# Patient Record
Sex: Male | Born: 1955 | Race: White | Hispanic: No | Marital: Married | Smoking: Never smoker
Health system: Southern US, Community
[De-identification: ages and names within clinical notes are randomized; demographics above are authoritative.]

## PROBLEM LIST (undated history)

## (undated) DIAGNOSIS — I10 Essential (primary) hypertension: Secondary | ICD-10-CM

## (undated) DIAGNOSIS — E785 Hyperlipidemia, unspecified: Secondary | ICD-10-CM

## (undated) DIAGNOSIS — F419 Anxiety disorder, unspecified: Secondary | ICD-10-CM

## (undated) HISTORY — PX: OTHER SURGICAL HISTORY: SHX169

## (undated) HISTORY — DX: Hyperlipidemia, unspecified: E78.5

## (undated) HISTORY — PX: HERNIA REPAIR: SHX51

---

## 2008-08-10 ENCOUNTER — Encounter: Admission: RE | Admit: 2008-08-10 | Discharge: 2008-08-10 | Payer: Self-pay | Admitting: Orthopedic Surgery

## 2008-08-17 ENCOUNTER — Ambulatory Visit (HOSPITAL_COMMUNITY): Admission: RE | Admit: 2008-08-17 | Discharge: 2008-08-17 | Payer: Self-pay | Admitting: Orthopedic Surgery

## 2008-12-01 ENCOUNTER — Encounter: Admission: RE | Admit: 2008-12-01 | Discharge: 2008-12-01 | Payer: Self-pay | Admitting: Orthopedic Surgery

## 2011-02-25 NOTE — Op Note (Signed)
NAME:  Sean Wells, MEDLEN NO.:  [id number]   MEDICAL RECORD NO.:  [id number]          PATIENT TYPE:  AMB   LOCATION:  DAY                          FACILITY:  [facility]   PHYSICIAN:  John L. Rendall, M.D.  DATE OF BIRTH:  [birth year]   DATE OF PROCEDURE:  08/17/2008  DATE OF DISCHARGE:                               OPERATIVE REPORT   PREOPERATIVE DIAGNOSIS:  Partial quadriceps tendon and complete vastus  medialis avulsion right knee.   POSTOPERATIVE DIAGNOSIS:  Partial quadriceps tendon and complete vastus  medialis avulsion right knee.   SURGICAL PROCEDURES:  Repair of quad tendon and vastus medialis avulsion  right knee.   SURGEON:  John L. Rendall, M.D.   ASSISTANT:  Legrand Pitts. Duffy, P.A.   ANESTHESIA:  General.   PATHOLOGY:  The patient has a history of sudden onset of severe weakness  in the right knee, inability to extend, x-ray showing patella baja and  MRI showing medial quadriceps tendon avulsion from the patella and  complete avulsion of the vastus medialis tendon from the patella.  These  findings were verified at surgery.   PROCEDURE IN DETAIL:  Under general anesthesia the right leg was  prepared with DuraPrep.  A 4 inch midline incision is made just slightly  medial over the patella extending from distal quad tendon to inferior  pole of the patella.  Dissection was carried medially around the side of  the patella and a complete avulsion of vastus medialis tendon from the  patella was found.  Dissection was then carried proximally and an area  of thinning of the quad tendon was found suggested medially where the  vastus medialis avulsed medially.  The capsule was entered and the quad  tendon was palpated from the inside.  It was felt that there was about a  sixteenth of an inch of quad tendon attached to the superior medial part  of the patella on its medial one-half whereas it should have been a full  quarter inch.  The superior pole of the patella  was exposed from the  intra-articular site and the normal attachment of the quadriceps was  denuded of fibrous tissue with a small curette.  Two drill holes were  placed and #2 Force Fiber was placed from the anterior lateral surface  of the patella up into the quad tendon in a modified Tajima stitch  pulling it down to the patella giving a quarter inch attachment  literally of the quad tendon to the entire medial half of the patella.  This stitch was not tied down immediately but similar drill holes were  placed medially after dissection out of the vastus medialis tendon.  In  a similar manner two drill holes were placed and a #2 Force Fiber was  used to grasp the vastus medialis tendon in a modified Tajima stitch  also pulling it to the patella in a prepared bone bed prepared by  curette.  These too were then tied to the patella medially and  superiorly.  Excellent repair of anatomic anatomy was felt to be  obtained.  Remaining defects in  the medial capsule and quad tendon were  then closed with #2 Ethibond.  Subcu was closed with zero Vicryl and  subcu further with 2-0 Vicryl.  Tourniquet time was 31 minutes.  Sterile  dressing was applied after skin clips.  The wound was infiltrated with  Marcaine 0.25% with epi, approximately 10 mL.      John L. Rendall, M.D.  Electronically Signed     JLR/MEDQ  D:  08/17/2008  T:  08/17/2008  Job:  [id number]

## 2011-07-15 LAB — DIFFERENTIAL
Basophils Relative: 0
Eosinophils Absolute: 0.1
Lymphs Abs: 1.1
Monocytes Absolute: 0.6
Monocytes Relative: 8
Neutrophils Relative %: 78 — ABNORMAL HIGH

## 2011-07-15 LAB — COMPREHENSIVE METABOLIC PANEL
ALT: 27
AST: 26
Alkaline Phosphatase: 65
CO2: 30
Chloride: 105
Glucose, Bld: 108 — ABNORMAL HIGH
Potassium: 4
Total Bilirubin: 1.2
Total Protein: 6.7

## 2011-07-15 LAB — CBC
MCV: 97.7
RBC: 4.91

## 2011-07-15 LAB — PROTIME-INR: Prothrombin Time: 12.7

## 2016-02-21 DIAGNOSIS — R1030 Lower abdominal pain, unspecified: Secondary | ICD-10-CM | POA: Diagnosis not present

## 2016-03-24 DIAGNOSIS — M79644 Pain in right finger(s): Secondary | ICD-10-CM | POA: Diagnosis not present

## 2016-07-07 DIAGNOSIS — K080 Exfoliation of teeth due to systemic causes: Secondary | ICD-10-CM | POA: Diagnosis not present

## 2016-08-04 DIAGNOSIS — Z6827 Body mass index (BMI) 27.0-27.9, adult: Secondary | ICD-10-CM | POA: Diagnosis not present

## 2016-08-04 DIAGNOSIS — Z0000 Encounter for general adult medical examination without abnormal findings: Secondary | ICD-10-CM | POA: Diagnosis not present

## 2016-08-04 DIAGNOSIS — R030 Elevated blood-pressure reading, without diagnosis of hypertension: Secondary | ICD-10-CM | POA: Diagnosis not present

## 2016-08-04 DIAGNOSIS — Z23 Encounter for immunization: Secondary | ICD-10-CM | POA: Diagnosis not present

## 2016-08-04 DIAGNOSIS — Z113 Encounter for screening for infections with a predominantly sexual mode of transmission: Secondary | ICD-10-CM | POA: Diagnosis not present

## 2016-08-04 DIAGNOSIS — Z6830 Body mass index (BMI) 30.0-30.9, adult: Secondary | ICD-10-CM | POA: Diagnosis not present

## 2016-08-21 DIAGNOSIS — R030 Elevated blood-pressure reading, without diagnosis of hypertension: Secondary | ICD-10-CM | POA: Diagnosis not present

## 2016-08-21 DIAGNOSIS — F5109 Other insomnia not due to a substance or known physiological condition: Secondary | ICD-10-CM | POA: Diagnosis not present

## 2016-08-21 DIAGNOSIS — F5102 Adjustment insomnia: Secondary | ICD-10-CM | POA: Diagnosis not present

## 2016-09-17 DIAGNOSIS — Z1211 Encounter for screening for malignant neoplasm of colon: Secondary | ICD-10-CM | POA: Diagnosis not present

## 2016-09-17 DIAGNOSIS — Z800 Family history of malignant neoplasm of digestive organs: Secondary | ICD-10-CM | POA: Diagnosis not present

## 2016-09-17 DIAGNOSIS — D125 Benign neoplasm of sigmoid colon: Secondary | ICD-10-CM | POA: Diagnosis not present

## 2016-09-17 DIAGNOSIS — K635 Polyp of colon: Secondary | ICD-10-CM | POA: Diagnosis not present

## 2016-09-17 DIAGNOSIS — D123 Benign neoplasm of transverse colon: Secondary | ICD-10-CM | POA: Diagnosis not present

## 2016-11-24 DIAGNOSIS — E782 Mixed hyperlipidemia: Secondary | ICD-10-CM | POA: Diagnosis not present

## 2016-11-24 DIAGNOSIS — I10 Essential (primary) hypertension: Secondary | ICD-10-CM | POA: Diagnosis not present

## 2016-11-25 DIAGNOSIS — E785 Hyperlipidemia, unspecified: Secondary | ICD-10-CM | POA: Diagnosis not present

## 2016-12-22 DIAGNOSIS — I10 Essential (primary) hypertension: Secondary | ICD-10-CM | POA: Diagnosis not present

## 2017-01-05 DIAGNOSIS — I10 Essential (primary) hypertension: Secondary | ICD-10-CM | POA: Diagnosis not present

## 2017-01-05 DIAGNOSIS — F419 Anxiety disorder, unspecified: Secondary | ICD-10-CM | POA: Diagnosis not present

## 2017-02-25 DIAGNOSIS — I10 Essential (primary) hypertension: Secondary | ICD-10-CM | POA: Diagnosis not present

## 2017-02-25 DIAGNOSIS — F419 Anxiety disorder, unspecified: Secondary | ICD-10-CM | POA: Diagnosis not present

## 2017-02-25 DIAGNOSIS — Z6827 Body mass index (BMI) 27.0-27.9, adult: Secondary | ICD-10-CM | POA: Diagnosis not present

## 2017-03-11 DIAGNOSIS — R1031 Right lower quadrant pain: Secondary | ICD-10-CM | POA: Diagnosis not present

## 2017-03-19 DIAGNOSIS — R1031 Right lower quadrant pain: Secondary | ICD-10-CM | POA: Diagnosis not present

## 2017-03-19 DIAGNOSIS — K5730 Diverticulosis of large intestine without perforation or abscess without bleeding: Secondary | ICD-10-CM | POA: Diagnosis not present

## 2017-04-27 DIAGNOSIS — F5221 Male erectile disorder: Secondary | ICD-10-CM | POA: Diagnosis not present

## 2017-04-27 DIAGNOSIS — I10 Essential (primary) hypertension: Secondary | ICD-10-CM | POA: Diagnosis not present

## 2017-04-27 DIAGNOSIS — F419 Anxiety disorder, unspecified: Secondary | ICD-10-CM | POA: Diagnosis not present

## 2017-07-28 ENCOUNTER — Other Ambulatory Visit: Payer: Self-pay

## 2017-07-28 ENCOUNTER — Encounter (HOSPITAL_COMMUNITY): Payer: Self-pay

## 2017-07-28 ENCOUNTER — Emergency Department (HOSPITAL_COMMUNITY): Payer: Federal, State, Local not specified - PPO

## 2017-07-28 ENCOUNTER — Emergency Department (HOSPITAL_COMMUNITY)
Admission: EM | Admit: 2017-07-28 | Discharge: 2017-07-28 | Disposition: A | Discharge status: Home / Self Care | Payer: Federal, State, Local not specified - PPO | Attending: Physician Assistant | Admitting: Physician Assistant

## 2017-07-28 DIAGNOSIS — Z79899 Other long term (current) drug therapy: Secondary | ICD-10-CM | POA: Diagnosis not present

## 2017-07-28 DIAGNOSIS — R071 Chest pain on breathing: Secondary | ICD-10-CM | POA: Insufficient documentation

## 2017-07-28 DIAGNOSIS — R0789 Other chest pain: Secondary | ICD-10-CM | POA: Diagnosis not present

## 2017-07-28 DIAGNOSIS — R0602 Shortness of breath: Secondary | ICD-10-CM | POA: Diagnosis not present

## 2017-07-28 DIAGNOSIS — R079 Chest pain, unspecified: Secondary | ICD-10-CM | POA: Diagnosis not present

## 2017-07-28 DIAGNOSIS — I10 Essential (primary) hypertension: Secondary | ICD-10-CM | POA: Diagnosis not present

## 2017-07-28 HISTORY — DX: Anxiety disorder, unspecified: F41.9

## 2017-07-28 HISTORY — DX: Essential (primary) hypertension: I10

## 2017-07-28 LAB — I-STAT TROPONIN, ED
Troponin i, poc: 0 ng/mL (ref 0.00–0.08)
Troponin i, poc: 0 ng/mL (ref 0.00–0.08)

## 2017-07-28 LAB — BASIC METABOLIC PANEL
ANION GAP: 9 (ref 5–15)
BUN: 14 mg/dL (ref 6–20)
CHLORIDE: 104 mmol/L (ref 101–111)
CO2: 24 mmol/L (ref 22–32)
Calcium: 9.3 mg/dL (ref 8.9–10.3)
Creatinine, Ser: 1.25 mg/dL — ABNORMAL HIGH (ref 0.61–1.24)
GFR calc Af Amer: >60 mL/min (ref >60)
GLUCOSE: 133 mg/dL — AB (ref 65–99)
POTASSIUM: 3.8 mmol/L (ref 3.5–5.1)
Sodium: 137 mmol/L (ref 135–145)

## 2017-07-28 LAB — CBC
HEMATOCRIT: 47.6 % (ref 39.0–52.0)
HEMOGLOBIN: 16.3 g/dL (ref 13.0–17.0)
MCH: 33 pg (ref 26.0–34.0)
MCHC: 34.2 g/dL (ref 30.0–36.0)
MCV: 96.4 fL (ref 78.0–100.0)
Platelets: 270 10*3/uL (ref 150–400)
RBC: 4.94 MIL/uL (ref 4.22–5.81)
RDW: 13 % (ref 11.5–15.5)
WBC: 8 10*3/uL (ref 4.0–10.5)

## 2017-07-28 MED ORDER — CYCLOBENZAPRINE HCL 10 MG PO TABS: 10.0000 mg | ORAL_TABLET | Freq: Two times a day (BID) | ORAL | 0 refills | Status: DC | PRN

## 2017-07-28 MED ORDER — IBUPROFEN 800 MG PO TABS: 800.0000 mg | ORAL_TABLET | Freq: Three times a day (TID) | ORAL | 0 refills | Status: DC

## 2017-07-28 NOTE — ED Provider Notes (Signed)
[facility] EMERGENCY DEPARTMENT Provider Note   CSN: [id number] Arrival date & time: 07/28/17  1139     History   Chief Complaint Chief Complaint  Patient presents with  . Chest Pain  . Shortness of Breath    HPI Demone Lyles is a 61 y.o. male.  HPI   Patient is a 61 year old male with past medical history significant for hypertension. No diabetes no cholesterol. Patient has a brother that had aortic valve disease. He has a mother that had "a large heart" at age 109.  Patient has chest pain intermittently since yesterday. He reports its only present when he takes deep breaths. Otherwise not better. Patient's had no cough no congestion. Patient reports it is also present when moving his arms occasionally. He says it feels like a pulled muscle.  Past Medical History:  Diagnosis Date  . Anxiety   . Hypertension     There are no active problems to display for this patient.   History reviewed. No pertinent surgical history.     Home Medications    Prior to Admission medications   Medication Sig Start Date End Date Taking? Authorizing Provider  amLODipine (NORVASC) 10 MG tablet Take 10 mg by mouth daily. 04/27/17  Yes [provider]  CREATINE PO Take 1 capsule by mouth daily.   Yes [provider]  ibuprofen (ADVIL,MOTRIN) 200 MG tablet Take 200 mg by mouth every 6 (six) hours as needed for moderate pain.   Yes [provider]  Multiple Vitamin (MULTIVITAMIN) tablet Take 1 tablet by mouth daily.   Yes [provider]  PARoxetine (PAXIL) 30 MG tablet Take 30 mg by mouth daily. 04/27/17  Yes [provider]    Family History No family history on file.  Social History Social History  Substance Use Topics  . Smoking status: Never Smoker  . Smokeless tobacco: Never Used  . Alcohol use Not on file     Allergies   Patient has no known allergies.   Review of Systems Review of Systems    Constitutional: Negative for activity change.  Respiratory: Negative for shortness of breath.   Cardiovascular: Positive for chest pain.  Gastrointestinal: Negative for abdominal pain.     Physical Exam Updated Vital Signs BP (!) 147/86   Pulse 65   Temp 98 F (36.7 C) (Oral)   Resp 17   Ht  (1.727 m)   Wt 86.2 kg (190 lb)   SpO2 98%   BMI 28.89 kg/m   Physical Exam  Constitutional: He is oriented to person, place, and time. He appears well-nourished.  HENT:  Head: Normocephalic.  Eyes: Conjunctivae are normal.  Cardiovascular: Normal rate and regular rhythm.   Pulmonary/Chest: Effort normal and breath sounds normal. No respiratory distress.  Tenderness to palpation of left chest wall.  Abdominal: Soft. He exhibits no distension. There is no tenderness.  Neurological: He is oriented to person, place, and time.  Skin: Skin is warm and dry. He is not diaphoretic.  Psychiatric: He has a normal mood and affect. His behavior is normal.     ED Treatments / Results  Labs (all labs ordered are listed, but only abnormal results are displayed) Labs Reviewed  BASIC METABOLIC PANEL - Abnormal; Notable for the following:       Result Value   Glucose, Bld 133 (*)    Creatinine, Ser 1.25 (*)    All other components within normal limits  CBC  I-STAT TROPONIN, ED  I-STAT TROPONIN, ED    EKG  EKG Interpretation  Date/Time:  Tuesday July 28 2017 11:43:23 EDT Ventricular Rate:  85 PR Interval:  158 QRS Duration: 150 QT Interval:  414 QTC Calculation: 492 R Axis:   -67 Text Interpretation:  Normal sinus rhythm Left bundle branch block Confirmed by Corlis Leak, Damaria Vachon ([id number]) on 07/28/2017 2:59:33 PM       Radiology Dg Chest 2 View  Result Date: 07/28/2017 CLINICAL DATA:  Chest pain, dizziness, some shortness of breath EXAM: CHEST  2 VIEW COMPARISON:  None. FINDINGS: No active infiltrate or effusion is seen. Mediastinal and hilar contours are unremarkable. Mild  cardiomegaly is present. No bony abnormality is seen. IMPRESSION: No active lung disease.  Mild cardiomegaly. Electronically Signed   By: Dwyane Dee M.D.   On: 07/28/2017 12:15    Procedures Procedures (including critical care time)  Medications Ordered in ED Medications - No data to display   Initial Impression / Assessment and Plan / ED Course  I have reviewed the triage vital signs and the nursing notes.  Pertinent labs & imaging results that were available during my care of the patient were reviewed by me and considered in my medical decision making (see chart for details).    Patient is a 61 year old male with past medical history significant for hypertension. No diabetes no cholesterol. Patient has a brother that had aortic valve disease. He has a mother that had "a large heart" at age 83.  Patient has chest pain intermittently since yesterday. He reports its only present when he takes deep breaths.nonexertional. Does not go to neck or down arms.  Patient's had no cough no congestion. Patient reports it is also present when moving his arms occasionally. He says it feels like a pulled muscle.   2:57 PM Heart score is 3.Delta troponin. Otherwise will diagnose with muscle sprain. Given patient's past medical history (mild cardiomegaly on cxr) we'll encourage him to follow up with cardiologist.patient had a lot of increased stressors at home.   Final Clinical Impressions(s) / ED Diagnoses   Final diagnoses:  None    New Prescriptions New Prescriptions   No medications on file     Abelino Derrick, MD 07/28/17 1502

## 2017-07-28 NOTE — Discharge Instructions (Signed)
We think this is likely a pulled muscle given that it is worse with breaths and moving. However you do  have history of high blood pressure which makes a risk for cardiac disease. Please follow-up with cardiologist and return if your chest pain worsens, shortness of breath, exertional chest pain or other concerns.

## 2017-08-03 DIAGNOSIS — Z23 Encounter for immunization: Secondary | ICD-10-CM | POA: Diagnosis not present

## 2017-08-03 DIAGNOSIS — I517 Cardiomegaly: Secondary | ICD-10-CM | POA: Diagnosis not present

## 2017-08-03 DIAGNOSIS — I10 Essential (primary) hypertension: Secondary | ICD-10-CM | POA: Diagnosis not present

## 2017-08-05 DIAGNOSIS — I340 Nonrheumatic mitral (valve) insufficiency: Secondary | ICD-10-CM | POA: Diagnosis not present

## 2017-08-05 DIAGNOSIS — I371 Nonrheumatic pulmonary valve insufficiency: Secondary | ICD-10-CM | POA: Diagnosis not present

## 2017-08-05 DIAGNOSIS — I517 Cardiomegaly: Secondary | ICD-10-CM | POA: Diagnosis not present

## 2017-08-12 ENCOUNTER — Ambulatory Visit (INDEPENDENT_AMBULATORY_CARE_PROVIDER_SITE_OTHER): Payer: Federal, State, Local not specified - PPO | Admitting: Cardiology

## 2017-08-12 VITALS — BP 110/70 | HR 64 | Resp 12 | Ht 68.25 in | Wt 189.0 lb

## 2017-08-12 DIAGNOSIS — I517 Cardiomegaly: Secondary | ICD-10-CM

## 2017-08-12 DIAGNOSIS — R0789 Other chest pain: Secondary | ICD-10-CM

## 2017-08-12 DIAGNOSIS — I10 Essential (primary) hypertension: Secondary | ICD-10-CM

## 2017-08-12 DIAGNOSIS — I429 Cardiomyopathy, unspecified: Secondary | ICD-10-CM

## 2017-08-12 DIAGNOSIS — I447 Left bundle-branch block, unspecified: Secondary | ICD-10-CM

## 2017-08-12 DIAGNOSIS — I420 Dilated cardiomyopathy: Secondary | ICD-10-CM | POA: Diagnosis not present

## 2017-08-12 HISTORY — DX: Left bundle-branch block, unspecified: I44.7

## 2017-08-12 HISTORY — DX: Other chest pain: R07.89

## 2017-08-12 HISTORY — DX: Cardiomyopathy, unspecified: I42.9

## 2017-08-12 HISTORY — DX: Cardiomegaly: I51.7

## 2017-08-12 MED ORDER — LISINOPRIL 2.5 MG PO TABS: 2.5000 mg | ORAL_TABLET | Freq: Every day | ORAL | 6 refills | Status: DC

## 2017-08-12 NOTE — Patient Instructions (Addendum)
Medication Instructions:  Your physician has recommended you make the following change in your medication:  1.) START Lisinopril 2.5 mg daily.  2.) DECREASE norvasc to 5 mg daily.   1. Avoid all over-the-counter antihistamines except Claritin/Loratadine and Zyrtec/Cetrizine. 2. Avoid all combination including cold sinus allergies flu decongestant and sleep medications 3. You can use Robitussin DM Mucinex and Mucinex DM for cough. 4. can use Tylenol aspirin ibuprofen and naproxen but no combinations such as sleep or sinus.  Labwork: Your physician recommends that you return for lab work in: 1 week  Testing/Procedures: EKG today in office.   Follow-Up: Your physician recommends that you schedule a follow-up appointment in: 2 weeks    Any Other Special Instructions Will Be Listed Below (If Applicable).  Please note that any paperwork needing to be filled out by the provider will need to be addressed at the front desk prior to seeing the provider. Please note that any paperwork FMLA, Disability or other documents regarding health condition is subject to a $25.00 charge that must be received prior to completion of paperwork in the form of a money order or check.     If you need a refill on your cardiac medications before your next appointment, please call your pharmacy.

## 2017-08-12 NOTE — Progress Notes (Signed)
Cardiology Consultation:    Date:  08/12/2017   ID:  Sean Wells, DOB [birth year], MRN [id number]  PCP:  [name], MD  Cardiologist:  [name], MD   Referring MD: [name], MD   Chief Complaint  Patient presents with  . Abnormal Echo  I have weak heart  History of Present Illness:    Sean Wells is a 61 y.o. male who is being seen today for the evaluation of cardiomyopathy at the request of [name], MD.  About 2 weeks ago he started experiencing some atypical chest pain located in the left side of his chest worse with taking deep breath worse with coughing he ended up getting to the emergency room troponin I was normal he was discharged home and he was told that he got musculoskeletal pain.  However, chest x-ray done at that time showed cardiomegaly.  He eventually was sent to [facility] for echocardiogram which showed ejection fraction of 35%.  He comes here to talk about that.  Doing well clinically he denies having any more chest pain tightness squeezing pressure burning chest.  He exercised on a regular basis however stopped after visiting the emergency because of chest pain.  He started back and have no difficulty doing it.  No swelling of lower extremities no tightness pressure burning in the chest.  He used to drink alcohol still drinks some wine and beer but not on a daily basis.  Family history of multiple cardiac issues  Past Medical History:  Diagnosis Date  . Anxiety   . Hyperlipidemia   . Hypertension     Past Surgical History:  Procedure Laterality Date  . HERNIA REPAIR    . Torn tendon and patella      Current Medications: Current Meds  Medication Sig  . amLODipine (NORVASC) 10 MG tablet Take 10 mg by mouth daily.  . Multiple Vitamin (MULTIVITAMIN) tablet Take 1 tablet by mouth daily.  . nebivolol (BYSTOLIC) 5 MG tablet Take 5 mg by mouth daily.  Marland Kitchen PARoxetine (PAXIL) 30 MG tablet Take 30 mg by mouth daily.  . Red Yeast  Rice Extract (RED YEAST RICE PO) Take 1 tablet by mouth daily.     Allergies:   Patient has no known allergies.   Social History   Social History  . Marital status: Divorced    Spouse name: N/A  . Number of children: N/A  . Years of education: N/A   Social History Main Topics  . Smoking status: Never Smoker  . Smokeless tobacco: Current User  . Alcohol use Yes  . Drug use: No  . Sexual activity: Not on file   Other Topics Concern  . Not on file   Social History Narrative  . No narrative on file     Family History: The patient's family history includes Cancer in his father; Heart failure in his mother. ROS:   Please see the history of present illness.    All 14 point review of systems negative except as described per history of present illness.  EKGs/Labs/Other Studies Reviewed:    The following studies were reviewed today: Normal sinus rhythm, left bundle branch block  EKG:  EKG is  ordered today.  The ekg ordered today demonstrates   Recent Labs: 07/28/2017: BUN 14; Creatinine, Ser 1.25; Hemoglobin 16.3; Platelets 270; Potassium 3.8; Sodium 137  Recent Lipid Panel No results found for: CHOL, TRIG, HDL, CHOLHDL, VLDL, LDLCALC, LDLDIRECT  Physical Exam:    VS:  BP 110/70  Pulse 64   Resp 12   Ht 5' 8.25" (1.734 m)   Wt 189 lb (85.7 kg)   BMI 28.53 kg/m     Wt Readings from Last 3 Encounters:  08/12/17 189 lb (85.7 kg)  07/28/17 190 lb (86.2 kg)     GEN:  Well nourished, well developed in no acute distress HEENT: Normal NECK: No JVD; No carotid bruits LYMPHATICS: No lymphadenopathy CARDIAC: RRR, no murmurs, no rubs, no gallops RESPIRATORY:  Clear to auscultation without rales, wheezing or rhonchi  ABDOMEN: Soft, non-tender, non-distended MUSCULOSKELETAL:  No edema; No deformity  SKIN: Warm and dry NEUROLOGIC:  Alert and oriented x 3 PSYCHIATRIC:  Normal affect   ASSESSMENT:    1. Essential hypertension   2. Dilated cardiomyopathy (HCC)   3.  Left bundle branch block (LBBB)   4. Atypical chest pain    PLAN:    In order of problems listed above:  1. Cardiomyopathy: The origin and etiology of this phenomenon is unclear at the moment.  He is already on beta-blocker which I will continue.  I will put him on small dose of ACE inhibitor only 2.5 g daily because of some baseline kidney dysfunction.  We will check his creatinine within the next week or so.  The goal will be to put him on the right medication see if there is any improvement.  In terms of evaluating him for etiology of his cardiomyopathy he may require cardiac catheterization.  Likely hemodynamically he seems to be compensated overall seems to be doing well. 2. Left bundle branch block.  I will repeat EEG. 3. Atypical chest pain: He may require cardiac catheterization or at least stress study for evaluation  Overall gentleman with a cardiomyopathy and left bundle branch block.  Kind of surprising discovery had some atypical chest pain we will try to manage him medically for now but he may require more aggressive management which can include cardiac catheterization   Medication Adjustments/Labs and Tests Ordered: Current medicines are reviewed at length with the patient today.  Concerns regarding medicines are outlined above.  No orders of the defined types were placed in this encounter.  No orders of the defined types were placed in this encounter.   Signed, Georgeanna Leaobert J. Krasowski, MD, [facility]. 08/12/2017 3:50 PM    [facility]

## 2017-08-26 DIAGNOSIS — R0789 Other chest pain: Secondary | ICD-10-CM | POA: Diagnosis not present

## 2017-08-26 DIAGNOSIS — I420 Dilated cardiomyopathy: Secondary | ICD-10-CM | POA: Diagnosis not present

## 2017-08-26 DIAGNOSIS — I10 Essential (primary) hypertension: Secondary | ICD-10-CM | POA: Diagnosis not present

## 2017-08-27 ENCOUNTER — Other Ambulatory Visit: Payer: Self-pay

## 2017-08-27 ENCOUNTER — Telehealth: Payer: Self-pay

## 2017-08-27 DIAGNOSIS — I10 Essential (primary) hypertension: Secondary | ICD-10-CM

## 2017-08-27 DIAGNOSIS — R0789 Other chest pain: Secondary | ICD-10-CM

## 2017-08-27 DIAGNOSIS — I420 Dilated cardiomyopathy: Secondary | ICD-10-CM

## 2017-08-27 LAB — BASIC METABOLIC PANEL
BUN/Creatinine Ratio: 15 (ref 10–24)
BUN: 16 mg/dL (ref 8–27)
CALCIUM: 9.3 mg/dL (ref 8.6–10.2)
CHLORIDE: 100 mmol/L (ref 96–106)
CO2: 27 mmol/L (ref 20–29)
Creatinine, Ser: 1.09 mg/dL (ref 0.76–1.27)
GFR calc Af Amer: 85 mL/min/{1.73_m2} (ref >59)
GFR, EST NON AFRICAN AMERICAN: 73 mL/min/{1.73_m2} (ref >59)
GLUCOSE: 90 mg/dL (ref 65–99)
POTASSIUM: 4.3 mmol/L (ref 3.5–5.2)
Sodium: 140 mmol/L (ref 134–144)

## 2017-08-27 MED ORDER — LISINOPRIL 5 MG PO TABS: 5.0000 mg | ORAL_TABLET | Freq: Every day | ORAL | 11 refills | Status: DC

## 2017-08-27 NOTE — Telephone Encounter (Signed)
-----   Message from Georgeanna Leaobert J Krasowski, MD sent at 08/27/2017  9:14 AM EST ----- chem7 looks good, double lisinopril to 5 mg po qd  chem7 in 1 week

## 2017-09-09 DIAGNOSIS — F4323 Adjustment disorder with mixed anxiety and depressed mood: Secondary | ICD-10-CM | POA: Diagnosis not present

## 2017-09-11 ENCOUNTER — Ambulatory Visit: Payer: Federal, State, Local not specified - PPO | Admitting: Cardiology

## 2017-09-11 ENCOUNTER — Encounter: Payer: Self-pay | Admitting: Cardiology

## 2017-09-11 VITALS — BP 104/74 | HR 63 | Ht 68.25 in | Wt 190.0 lb

## 2017-09-11 DIAGNOSIS — I447 Left bundle-branch block, unspecified: Secondary | ICD-10-CM

## 2017-09-11 DIAGNOSIS — I517 Cardiomegaly: Secondary | ICD-10-CM

## 2017-09-11 DIAGNOSIS — R0789 Other chest pain: Secondary | ICD-10-CM

## 2017-09-11 DIAGNOSIS — I10 Essential (primary) hypertension: Secondary | ICD-10-CM | POA: Diagnosis not present

## 2017-09-11 DIAGNOSIS — I420 Dilated cardiomyopathy: Secondary | ICD-10-CM | POA: Diagnosis not present

## 2017-09-11 MED ORDER — LISINOPRIL 10 MG PO TABS: 10.0000 mg | ORAL_TABLET | Freq: Every day | ORAL | 1 refills | Status: DC

## 2017-09-11 NOTE — Addendum Note (Signed)
Addended by: Craige CottaANDERSON, Harshita Bernales S on: 09/11/2017 11:58 AM   Modules accepted: Orders

## 2017-09-11 NOTE — Progress Notes (Signed)
Cardiology Office Note:    Date:  09/11/2017   ID:  Sean Wells, DOB August 19, 1956, MRN [id number]  PCP:  Alinda DeemPenner, Pamela, MD  Cardiologist:  Gypsy Balsamobert Krasowski, MD    Referring MD: Alinda DeemPenner, Pamela, MD   No chief complaint on file. Doing well  History of Present Illness:    Sean Wells is a 61 y.o. male with cardiomyopathy which was incidental discovery.  Gradually trying to put him on appropriate medications.  Today he is doing well his blood pressures in the lower side therefore I will discontinue his Norvasc I will increase dose of lisinopril from 5-10 we will check his Chem-7 today we will check his Chem-7 in about 3 days.  Talk to me about by systolic which appears to be price he got 2368-month supply and after that he wants to change to a different one I think carvedilol will be a good choice for him.  We talked again about potentially having cardiac catheterization he said he feels good he denies have any chest pain goes to gym on the regular basis and we reach at the conclusion that we will recheck his heart in about 2-3 months if there is no improvement then we will proceed with cardiac catheterization.  Past Medical History:  Diagnosis Date  . Anxiety   . Hyperlipidemia   . Hypertension     Past Surgical History:  Procedure Laterality Date  . HERNIA REPAIR    . Torn tendon and patella      Current Medications: Current Meds  Medication Sig  . amLODipine (NORVASC) 10 MG tablet Take 5 mg by mouth daily.  Marland Kitchen. lisinopril (PRINIVIL,ZESTRIL) 5 MG tablet Take 1 tablet (5 mg total) daily by mouth.  . Multiple Vitamin (MULTIVITAMIN) tablet Take 1 tablet by mouth daily.  . nebivolol (BYSTOLIC) 5 MG tablet Take 5 mg by mouth daily.  Marland Kitchen. PARoxetine (PAXIL) 30 MG tablet Take 30 mg by mouth daily.  . Red Yeast Rice Extract (RED YEAST RICE PO) Take 1 tablet by mouth daily.     Allergies:   Patient has no known allergies.   Social History   Socioeconomic History  . Marital status:  Divorced    Spouse name: None  . Number of children: None  . Years of education: None  . Highest education level: None  Social Needs  . Financial resource strain: None  . Food insecurity - worry: None  . Food insecurity - inability: None  . Transportation needs - medical: None  . Transportation needs - non-medical: None  Occupational History  . None  Tobacco Use  . Smoking status: Never Smoker  . Smokeless tobacco: Current User  Substance and Sexual Activity  . Alcohol use: Yes  . Drug use: No  . Sexual activity: None  Other Topics Concern  . None  Social History Narrative  . None     Family History: The patient's family history includes Cancer in his father; Heart failure in his mother. ROS:   Please see the history of present illness.    All 14 point review of systems negative except as described per history of present illness  EKGs/Labs/Other Studies Reviewed:      Recent Labs: 07/28/2017: Hemoglobin 16.3; Platelets 270 08/26/2017: BUN 16; Creatinine, Ser 1.09; Potassium 4.3; Sodium 140  Recent Lipid Panel No results found for: CHOL, TRIG, HDL, CHOLHDL, VLDL, LDLCALC, LDLDIRECT  Physical Exam:    VS:  BP 104/74 (BP Location: Right Arm, Patient Position: Sitting, Cuff Size: Normal)  Pulse 63   Ht 5' 8.25" (1.734 m)   Wt 190 lb (86.2 kg)   SpO2 97%   BMI 28.68 kg/m     Wt Readings from Last 3 Encounters:  09/11/17 190 lb (86.2 kg)  08/12/17 189 lb (85.7 kg)  07/28/17 190 lb (86.2 kg)     GEN:  Well nourished, well developed in no acute distress HEENT: Normal NECK: No JVD; No carotid bruits LYMPHATICS: No lymphadenopathy CARDIAC: RRR, no murmurs, no rubs, no gallops RESPIRATORY:  Clear to auscultation without rales, wheezing or rhonchi  ABDOMEN: Soft, non-tender, non-distended MUSCULOSKELETAL:  No edema; No deformity  SKIN: Warm and dry LOWER EXTREMITIES: no swelling NEUROLOGIC:  Alert and oriented x 3 PSYCHIATRIC:  Normal affect   ASSESSMENT:      1. Cardiomegaly   2. Dilated cardiomyopathy (HCC)   3. Essential hypertension   4. Left bundle branch block (LBBB)   5. Atypical chest pain    PLAN:    In order of problems listed above:  1. Cardiomegaly with ejection fraction being diminished: Plan as outlined above we will increase the dose of ACE inhibitor will follow up his kidney function.  We will discontinue his Norvasc. 2. Essential hypertension: We are dealing with the problem of low blood pressure now.  Plan as outlined above 3. Atypical chest pain all the time lasting for days straight of the left side not related to exertion on the same time he can go and exercise in the gym with no difficulties.  Again issue of cardiac catheterization as discussed above.   Medication Adjustments/Labs and Tests Ordered: Current medicines are reviewed at length with the patient today.  Concerns regarding medicines are outlined above.  No orders of the defined types were placed in this encounter.  Medication changes: No orders of the defined types were placed in this encounter.   Signed, Georgeanna Leaobert J. Krasowski, MD, [facility] 09/11/2017 11:46 AM    [facility]

## 2017-09-11 NOTE — Patient Instructions (Signed)
Medication Instructions:  Your physician has recommended you make the following change in your medication:  CHANGE Lisinopril to 10 mg daily STOP Amlodipine  Labwork: Your physician recommends that you have the following labs drawn: BMP today and another BMP in 1 week  Testing/Procedures: None  Follow-Up: Your physician recommends that you schedule a follow-up appointment in: 3 weeks   Any Other Special Instructions Will Be Listed Below (If Applicable).     If you need a refill on your cardiac medications before your next appointment, please call your pharmacy.   [facility]  Garey HamAshley A, RN, BSN

## 2017-09-12 LAB — BASIC METABOLIC PANEL
BUN / CREAT RATIO: 13 (ref 10–24)
BUN: 17 mg/dL (ref 8–27)
CO2: 25 mmol/L (ref 20–29)
CREATININE: 1.32 mg/dL — AB (ref 0.76–1.27)
Calcium: 9.6 mg/dL (ref 8.6–10.2)
Chloride: 101 mmol/L (ref 96–106)
GFR calc Af Amer: 67 mL/min/{1.73_m2} (ref >59)
GFR calc non Af Amer: 58 mL/min/{1.73_m2} — ABNORMAL LOW (ref >59)
GLUCOSE: 95 mg/dL (ref 65–99)
POTASSIUM: 4.4 mmol/L (ref 3.5–5.2)
SODIUM: 140 mmol/L (ref 134–144)

## 2017-09-14 ENCOUNTER — Telehealth: Payer: Self-pay

## 2017-09-14 DIAGNOSIS — I420 Dilated cardiomyopathy: Secondary | ICD-10-CM

## 2017-09-14 NOTE — Telephone Encounter (Signed)
Patient informed of results. [place] [facility] [place].

## 2017-09-14 NOTE — Telephone Encounter (Signed)
-----   Message from Georgeanna Leaobert J Krasowski, MD sent at 09/14/2017  8:52 AM EST ----- Please recheck chem 7 on wednesday

## 2017-09-16 DIAGNOSIS — I10 Essential (primary) hypertension: Secondary | ICD-10-CM | POA: Diagnosis not present

## 2017-09-16 DIAGNOSIS — F4323 Adjustment disorder with mixed anxiety and depressed mood: Secondary | ICD-10-CM | POA: Diagnosis not present

## 2017-09-16 DIAGNOSIS — I420 Dilated cardiomyopathy: Secondary | ICD-10-CM | POA: Diagnosis not present

## 2017-09-16 LAB — BASIC METABOLIC PANEL
BUN/Creatinine Ratio: 17 (ref 10–24)
BUN: 19 mg/dL (ref 8–27)
CHLORIDE: 103 mmol/L (ref 96–106)
CO2: 26 mmol/L (ref 20–29)
Calcium: 10 mg/dL (ref 8.6–10.2)
Creatinine, Ser: 1.13 mg/dL (ref 0.76–1.27)
GFR calc Af Amer: 81 mL/min/{1.73_m2} (ref >59)
GFR, EST NON AFRICAN AMERICAN: 70 mL/min/{1.73_m2} (ref >59)
GLUCOSE: 99 mg/dL (ref 65–99)
POTASSIUM: 4.5 mmol/L (ref 3.5–5.2)
SODIUM: 137 mmol/L (ref 134–144)

## 2017-09-24 DIAGNOSIS — F4323 Adjustment disorder with mixed anxiety and depressed mood: Secondary | ICD-10-CM | POA: Diagnosis not present

## 2017-10-02 ENCOUNTER — Ambulatory Visit: Payer: Federal, State, Local not specified - PPO | Admitting: Cardiology

## 2017-10-02 ENCOUNTER — Encounter: Payer: Self-pay | Admitting: Cardiology

## 2017-10-02 VITALS — BP 134/66 | HR 113 | Ht 68.25 in | Wt 191.8 lb

## 2017-10-02 DIAGNOSIS — I10 Essential (primary) hypertension: Secondary | ICD-10-CM

## 2017-10-02 DIAGNOSIS — I420 Dilated cardiomyopathy: Secondary | ICD-10-CM | POA: Diagnosis not present

## 2017-10-02 DIAGNOSIS — I447 Left bundle-branch block, unspecified: Secondary | ICD-10-CM | POA: Diagnosis not present

## 2017-10-02 DIAGNOSIS — R0789 Other chest pain: Secondary | ICD-10-CM | POA: Diagnosis not present

## 2017-10-02 MED ORDER — SACUBITRIL-VALSARTAN 24-26 MG PO TABS: 1.0000 | ORAL_TABLET | Freq: Two times a day (BID) | ORAL | Status: DC

## 2017-10-02 NOTE — Progress Notes (Signed)
Cardiology Office Note:    Date:  10/02/2017   ID:  Sean Wells, DOB 1956-02-02, MRN [id number]  PCP:  Alinda DeemPenner, Pamela, MD  Cardiologist:  Gypsy Balsamobert Elianis Fischbach, MD    Referring MD: Alinda DeemPenner, Pamela, MD   Chief Complaint  Patient presents with  . 3 week follow up  Doing well denies have any shortness of breath chest pain palpitations no dizziness passing out  History of Present Illness:    Sean Wells is a 61 y.o. male with cardiomyopathy which is unclear origin.  Denies having any symptoms except some shortness of breath with exertion.  He goes to gym on the regular basis typically lifts weight and have no difficulty doing it.  To be tolerating medications well however I think he can benefit from [place].  I will give him samples of Entresto today ask him to stop lisinopril for 3 days before initiating therapy with Entresto.  I asked him to let me know if anything unusual happen with new medication.  Past Medical History:  Diagnosis Date  . Anxiety   . Hyperlipidemia   . Hypertension     Past Surgical History:  Procedure Laterality Date  . HERNIA REPAIR    . Torn tendon and patella      Current Medications: Current Meds  Medication Sig  . lisinopril (PRINIVIL,ZESTRIL) 10 MG tablet Take 1 tablet (10 mg total) by mouth daily.  . Multiple Vitamin (MULTIVITAMIN) tablet Take 1 tablet by mouth daily.  . nebivolol (BYSTOLIC) 5 MG tablet Take 5 mg by mouth daily.  Marland Kitchen. PARoxetine (PAXIL) 30 MG tablet Take 30 mg by mouth daily.  . Red Yeast Rice Extract (RED YEAST RICE PO) Take 1 tablet by mouth daily.     Allergies:   Patient has no known allergies.   Social History   Socioeconomic History  . Marital status: Divorced    Spouse name: None  . Number of children: None  . Years of education: None  . Highest education level: None  Social Needs  . Financial resource strain: None  . Food insecurity - worry: None  . Food insecurity - inability: None  . Transportation needs -  medical: None  . Transportation needs - non-medical: None  Occupational History  . None  Tobacco Use  . Smoking status: Never Smoker  . Smokeless tobacco: Current User  Substance and Sexual Activity  . Alcohol use: Yes  . Drug use: No  . Sexual activity: None  Other Topics Concern  . None  Social History Narrative  . None     Family History: The patient's family history includes Cancer in his father; Heart failure in his mother. ROS:   Please see the history of present illness.    All 14 point review of systems negative except as described per history of present illness  EKGs/Labs/Other Studies Reviewed:      Recent Labs: 07/28/2017: Hemoglobin 16.3; Platelets 270 09/16/2017: BUN 19; Creatinine, Ser 1.13; Potassium 4.5; Sodium 137  Recent Lipid Panel No results found for: CHOL, TRIG, HDL, CHOLHDL, VLDL, LDLCALC, LDLDIRECT  Physical Exam:    VS:  BP 134/66   Pulse (!) 113   Ht 5' 8.25" (1.734 m)   Wt 191 lb 12.8 oz (87 kg)   SpO2 98%   BMI 28.95 kg/m     Wt Readings from Last 3 Encounters:  10/02/17 191 lb 12.8 oz (87 kg)  09/11/17 190 lb (86.2 kg)  08/12/17 189 lb (85.7 kg)     GEN:  Well nourished, well developed in no acute distress HEENT: Normal NECK: No JVD; No carotid bruits LYMPHATICS: No lymphadenopathy CARDIAC: RRR, no murmurs, no rubs, no gallops RESPIRATORY:  Clear to auscultation without rales, wheezing or rhonchi  ABDOMEN: Soft, non-tender, non-distended MUSCULOSKELETAL:  No edema; No deformity  SKIN: Warm and dry LOWER EXTREMITIES: no swelling NEUROLOGIC:  Alert and oriented x 3 PSYCHIATRIC:  Normal affect   ASSESSMENT:    1. Dilated cardiomyopathy (HCC)   2. Essential hypertension   3. Left bundle branch block (LBBB)   4. Atypical chest pain    PLAN:    In order of problems listed above:  1. Dilated cardiomyopathy: On a beta-blocker which I will continue, we will switch him from lisinopril to [facility]. 2. Essential hypertension  blood pressure seems to be well controlled we will continue present management 3. Left bundle branch block, chronic continue present management 4. Atypical chest pain: Denies having any   Medication Adjustments/Labs and Tests Ordered: Current medicines are reviewed at length with the patient today.  Concerns regarding medicines are outlined above.  No orders of the defined types were placed in this encounter.  Medication changes: No orders of the defined types were placed in this encounter.   Signed, Georgeanna Leaobert J. Irva Loser, MD, [facility] 10/02/2017 11:13 AM    [facility]

## 2017-10-02 NOTE — Patient Instructions (Signed)
Medication Instructions:  Your physician has recommended you make the following change in your medication:  1) STOP Lisinopril 2) Start Entresto 24/24 mg 1 tablet twice daily  Labwork: None Ordered  Testing/Procedures: None ordered  Follow-Up: Your physician recommends that you schedule a follow-up appointment in: 1 month Dr. Bing MatterKrasowski  Any Other Special Instructions Will Be Listed Below (If Applicable).     If you need a refill on your cardiac medications before your next appointment, please call your pharmacy.

## 2017-10-14 ENCOUNTER — Telehealth: Payer: Self-pay | Admitting: Cardiology

## 2017-10-14 DIAGNOSIS — F4323 Adjustment disorder with mixed anxiety and depressed mood: Secondary | ICD-10-CM | POA: Diagnosis not present

## 2017-10-14 NOTE — Telephone Encounter (Signed)
Patient states that he was dizzy yesterday, not today. Chest tightness comes and goes. Patient does not want to go to emergency department. Asked patient if he would like to reschedule for sooner appointment. Patient states he will wait and see if he feels better. Advised patient again to go to emergency room if symptoms worsen. Patient verbalized understanding. No further questions.

## 2017-10-14 NOTE — Telephone Encounter (Signed)
Patient called and states he has chest tightness, is tires and dizzy. He does not want to go to ER. Patient [facility] and wonders if this is the cause. Patient told to go to ER if symptoms get worse before call back.

## 2017-10-22 DIAGNOSIS — F4323 Adjustment disorder with mixed anxiety and depressed mood: Secondary | ICD-10-CM | POA: Diagnosis not present

## 2017-10-28 DIAGNOSIS — F4323 Adjustment disorder with mixed anxiety and depressed mood: Secondary | ICD-10-CM | POA: Diagnosis not present

## 2017-11-02 ENCOUNTER — Encounter: Payer: Self-pay | Admitting: Cardiology

## 2017-11-02 ENCOUNTER — Ambulatory Visit: Payer: Federal, State, Local not specified - PPO | Admitting: Cardiology

## 2017-11-02 VITALS — BP 120/78 | HR 59 | Ht 68.25 in | Wt 192.0 lb

## 2017-11-02 DIAGNOSIS — I420 Dilated cardiomyopathy: Secondary | ICD-10-CM

## 2017-11-02 DIAGNOSIS — I447 Left bundle-branch block, unspecified: Secondary | ICD-10-CM | POA: Diagnosis not present

## 2017-11-02 DIAGNOSIS — I10 Essential (primary) hypertension: Secondary | ICD-10-CM

## 2017-11-02 NOTE — Progress Notes (Signed)
Cardiology Office Note:    Date:  11/02/2017   ID:  Sean Wells, DOB 04-Dec-1955, MRN [id number]  PCP:  [name], MD  Cardiologist:  [name], MD    Referring MD: [name], MD   Chief Complaint  Patient presents with  . Follow-up  Doing well  History of Present Illness:    Sean Wells is a 62 y.o. male with cardiomyopathy so far unclear origin.  He does have also a left bundle branch block.  Seems to be tolerating medications quite well.  Last time I put him on Entresto.  Now he complains of by systolic being very expensive and the goal is to finish his by systolic and then switch him to carvedilol.  He comes today with his wife and she complains that every single night he sweats a lot and he can be completely wet that required changing his clothes during the night.  Denies having any nightmares but of course concerns about potentially having bradycardia.  He does snore a lot and does some apneic episodes but those are rather rare.  Talking about potentially doing sleep study however he wants to hold off on that. Plan for today will be to switch him from Bystolic to carvedilol 12.5 twice daily.  I will check his Chem-7 to see if I will be able to increase his Entresto.  I will schedule him to have another echocardiogram to reassess his left ventricular ejection fraction.  If his ejection fraction still significantly diminished then we need to do definitely CAD workup which will be either cardiac catheterization most likely or stress testing.  I discussed those issues with him.  Past Medical History:  Diagnosis Date  . Anxiety   . Hyperlipidemia   . Hypertension     Past Surgical History:  Procedure Laterality Date  . HERNIA REPAIR    . Torn tendon and patella      Current Medications: Current Meds  Medication Sig  . Multiple Vitamin (MULTIVITAMIN) tablet Take 1 tablet by mouth daily.  . nebivolol (BYSTOLIC) 5 MG tablet Take 5 mg by mouth daily.  Marland Kitchen  PARoxetine (PAXIL) 30 MG tablet Take 30 mg by mouth daily.  . Red Yeast Rice Extract (RED YEAST RICE PO) Take 1 tablet by mouth daily.   Current Facility-Administered Medications for the 11/02/17 encounter (Office Visit) with Georgeanna Lea, MD  Medication  . sacubitril-valsartan (ENTRESTO) 24-26 mg per tablet     Allergies:   Patient has no known allergies.   Social History   Socioeconomic History  . Marital status: Divorced    Spouse name: None  . Number of children: None  . Years of education: None  . Highest education level: None  Social Needs  . Financial resource strain: None  . Food insecurity - worry: None  . Food insecurity - inability: None  . Transportation needs - medical: None  . Transportation needs - non-medical: None  Occupational History  . None  Tobacco Use  . Smoking status: Never Smoker  . Smokeless tobacco: Current User  Substance and Sexual Activity  . Alcohol use: Yes  . Drug use: No  . Sexual activity: None  Other Topics Concern  . None  Social History Narrative  . None     Family History: The patient's family history includes Cancer in his father; Heart failure in his mother. ROS:   Please see the history of present illness.    All 14 point review of systems negative except as  described per history of present illness  EKGs/Labs/Other Studies Reviewed:      Recent Labs: 07/28/2017: Hemoglobin 16.3; Platelets 270 09/16/2017: BUN 19; Creatinine, Ser 1.13; Potassium 4.5; Sodium 137  Recent Lipid Panel No results found for: CHOL, TRIG, HDL, CHOLHDL, VLDL, LDLCALC, LDLDIRECT  Physical Exam:    VS:  BP 120/78 (BP Location: Right Arm, Patient Position: Sitting, Cuff Size: Normal)   Pulse (!) 59   Ht 5' 8.25" (1.734 m)   Wt 192 lb (87.1 kg)   SpO2 96%   BMI 28.98 kg/m     Wt Readings from Last 3 Encounters:  11/02/17 192 lb (87.1 kg)  10/02/17 191 lb 12.8 oz (87 kg)  09/11/17 190 lb (86.2 kg)     GEN:  Well nourished, well  developed in no acute distress HEENT: Normal NECK: No JVD; No carotid bruits LYMPHATICS: No lymphadenopathy CARDIAC: RRR, no murmurs, no rubs, no gallops RESPIRATORY:  Clear to auscultation without rales, wheezing or rhonchi  ABDOMEN: Soft, non-tender, non-distended MUSCULOSKELETAL:  No edema; No deformity  SKIN: Warm and dry LOWER EXTREMITIES: no swelling NEUROLOGIC:  Alert and oriented x 3 PSYCHIATRIC:  Normal affect   ASSESSMENT:    1. Dilated cardiomyopathy (HCC)   2. Essential hypertension   3. Left bundle branch block (LBBB)    PLAN:    In order of problems listed above:  1. Dilated cardia myopathy: As outlined above. 2. Essential hypertension: Blood pressure well controlled. 3. Left bundle branch block.  Noted.   Medication Adjustments/Labs and Tests Ordered: Current medicines are reviewed at length with the patient today.  Concerns regarding medicines are outlined above.  No orders of the defined types were placed in this encounter.  Medication changes: No orders of the defined types were placed in this encounter.   Signed, Georgeanna Leaobert J. Krasowski, MD, [facility] 11/02/2017 11:13 AM    [facility]

## 2017-11-02 NOTE — Patient Instructions (Signed)
Medication Instructions:  Your physician has recommended you make the following change in your medication:  1) Finish Bystolic 2) Start Carvedilol 12.5 mg 1 tablet 2 times daily  Labwork: Your physician recommends that you have lab work today: BMP  Testing/Procedures: Your physician has requested that you have an echocardiogram. Echocardiography is a painless test that uses sound waves to create images of your heart. It provides your doctor with information about the size and shape of your heart and how well your heart's chambers and valves are working. This procedure takes approximately one hour. There are no restrictions for this procedure.  Your physician has recommended that you wear a holter monitor. Holter monitors are medical devices that record the heart's electrical activity. Doctors most often use these monitors to diagnose arrhythmias. Arrhythmias are problems with the speed or rhythm of the heartbeat. The monitor is a small, portable device. You can wear one while you do your normal daily activities. This is usually used to diagnose what is causing palpitations/syncope (passing out). You will wear this for 24 hours. Be prepared to return to the office the next day for removal.  Follow-Up: Your physician recommends that you schedule a follow-up appointment in: 1 month with Dr. Bing MatterKrasowski   Any Other Special Instructions Will Be Listed Below (If Applicable).     If you need a refill on your cardiac medications before your next appointment, please call your pharmacy.

## 2017-11-03 ENCOUNTER — Telehealth: Payer: Self-pay | Admitting: Cardiology

## 2017-11-03 LAB — BASIC METABOLIC PANEL
BUN/Creatinine Ratio: 14 (ref 10–24)
BUN: 17 mg/dL (ref 8–27)
CHLORIDE: 101 mmol/L (ref 96–106)
CO2: 24 mmol/L (ref 20–29)
CREATININE: 1.24 mg/dL (ref 0.76–1.27)
Calcium: 9.3 mg/dL (ref 8.6–10.2)
GFR calc Af Amer: 72 mL/min/{1.73_m2} (ref >59)
GFR calc non Af Amer: 62 mL/min/{1.73_m2} (ref >59)
GLUCOSE: 108 mg/dL — AB (ref 65–99)
Potassium: 4.4 mmol/L (ref 3.5–5.2)
Sodium: 139 mmol/L (ref 134–144)

## 2017-11-03 NOTE — Telephone Encounter (Signed)
°*  STAT* If patient is at the pharmacy, call can be transferred to refill team.   1. Which medications need to be refilled? (please list name of each medication and dose if known) Entresto Possible Samples  2. Which pharmacy/location (including street and city if local pharmacy) is medication to be sent to? [facility] In [place]  3. Do they need a 30 day or 90 day supply?   Patient states that he did not have as many as he thought and pending labwork from 11/02/2017..Marland Kitchen

## 2017-11-04 ENCOUNTER — Other Ambulatory Visit: Payer: Self-pay

## 2017-11-04 MED ORDER — SACUBITRIL-VALSARTAN 49-51 MG PO TABS: 1.0000 | ORAL_TABLET | Freq: Two times a day (BID) | ORAL | 1 refills | Status: DC

## 2017-11-04 NOTE — Telephone Encounter (Signed)
Patient has been contacted with lab results and [name] has been adjusted and sent to pharmacy

## 2017-11-05 ENCOUNTER — Telehealth: Payer: Self-pay | Admitting: Cardiology

## 2017-11-05 NOTE — Telephone Encounter (Signed)
Contacted pt to notify him that we have samples of Entresto 49-51mg , that has been left at front desk for pickup.   Pt advised that PA will be started, and he will be notified once we receive notification.   Pt verbalized understanding.

## 2017-11-05 NOTE — Telephone Encounter (Signed)
Patient needs a refill of Entresto please

## 2017-11-05 NOTE — Telephone Encounter (Signed)
Patient has called and states that pharmacy states he needs PA and he has not had med in 2 days..please can we start PA process?

## 2017-11-06 DIAGNOSIS — Y9301 Activity, walking, marching and hiking: Secondary | ICD-10-CM | POA: Diagnosis not present

## 2017-11-06 DIAGNOSIS — S0031XA Abrasion of nose, initial encounter: Secondary | ICD-10-CM | POA: Diagnosis not present

## 2017-11-06 DIAGNOSIS — R55 Syncope and collapse: Secondary | ICD-10-CM | POA: Diagnosis not present

## 2017-11-06 DIAGNOSIS — S0990XA Unspecified injury of head, initial encounter: Secondary | ICD-10-CM | POA: Diagnosis not present

## 2017-11-06 DIAGNOSIS — S0993XA Unspecified injury of face, initial encounter: Secondary | ICD-10-CM | POA: Diagnosis not present

## 2017-11-06 DIAGNOSIS — W010XXA Fall on same level from slipping, tripping and stumbling without subsequent striking against object, initial encounter: Secondary | ICD-10-CM | POA: Diagnosis not present

## 2017-11-09 DIAGNOSIS — I447 Left bundle-branch block, unspecified: Secondary | ICD-10-CM | POA: Diagnosis not present

## 2017-11-09 NOTE — Telephone Encounter (Signed)
Prior Auth has been submitted.

## 2017-11-10 DIAGNOSIS — F419 Anxiety disorder, unspecified: Secondary | ICD-10-CM | POA: Diagnosis not present

## 2017-11-10 DIAGNOSIS — E782 Mixed hyperlipidemia: Secondary | ICD-10-CM | POA: Diagnosis not present

## 2017-11-10 DIAGNOSIS — I428 Other cardiomyopathies: Secondary | ICD-10-CM | POA: Diagnosis not present

## 2017-11-10 DIAGNOSIS — I10 Essential (primary) hypertension: Secondary | ICD-10-CM | POA: Diagnosis not present

## 2017-11-17 ENCOUNTER — Telehealth: Payer: Self-pay | Admitting: Cardiology

## 2017-11-17 NOTE — Telephone Encounter (Signed)
error 

## 2017-11-17 NOTE — Telephone Encounter (Signed)
Patient states that the [employer] ins has denied paying for Entresto due to him ot being on a Beta Blocker.. Please call him because he is on a Beta Blocker.

## 2017-11-17 NOTE — Telephone Encounter (Signed)
Informed patient that his approval letter was received via fax today.

## 2017-11-18 DIAGNOSIS — F4323 Adjustment disorder with mixed anxiety and depressed mood: Secondary | ICD-10-CM | POA: Diagnosis not present

## 2017-11-20 ENCOUNTER — Ambulatory Visit (HOSPITAL_BASED_OUTPATIENT_CLINIC_OR_DEPARTMENT_OTHER)
Admission: RE | Admit: 2017-11-20 | Discharge: 2017-11-20 | Disposition: A | Discharge status: Home / Self Care | Payer: Federal, State, Local not specified - PPO | Source: Ambulatory Visit | Attending: Cardiology | Admitting: Cardiology

## 2017-11-20 ENCOUNTER — Ambulatory Visit: Payer: Federal, State, Local not specified - PPO

## 2017-11-20 DIAGNOSIS — I119 Hypertensive heart disease without heart failure: Secondary | ICD-10-CM | POA: Diagnosis not present

## 2017-11-20 DIAGNOSIS — I447 Left bundle-branch block, unspecified: Secondary | ICD-10-CM

## 2017-11-20 DIAGNOSIS — I10 Essential (primary) hypertension: Secondary | ICD-10-CM

## 2017-11-20 DIAGNOSIS — R079 Chest pain, unspecified: Secondary | ICD-10-CM | POA: Diagnosis not present

## 2017-11-20 DIAGNOSIS — I420 Dilated cardiomyopathy: Secondary | ICD-10-CM | POA: Diagnosis not present

## 2017-11-20 DIAGNOSIS — I340 Nonrheumatic mitral (valve) insufficiency: Secondary | ICD-10-CM | POA: Insufficient documentation

## 2017-11-20 NOTE — Progress Notes (Signed)
Echocardiogram 2D Echocardiogram has been performed.  Sean Wells, Sean Wells 11/20/2017, 12:09 PM

## 2017-11-23 ENCOUNTER — Telehealth: Payer: Self-pay | Admitting: Cardiology

## 2017-11-23 MED ORDER — CARVEDILOL 12.5 MG PO TABS: 12.5000 mg | ORAL_TABLET | Freq: Two times a day (BID) | ORAL | 1 refills | Status: DC

## 2017-11-23 NOTE — Telephone Encounter (Signed)
Only has a week left of bystolic

## 2017-11-23 NOTE — Telephone Encounter (Signed)
Went over starting Carvedilol with patient after AutoZonefinishing Bystolic. Sent in new prescription.

## 2017-11-24 ENCOUNTER — Telehealth: Payer: Self-pay | Admitting: Cardiology

## 2017-11-24 NOTE — Telephone Encounter (Signed)
Patient called to say that the [facility] prescribed is $440 for a 45 day supply. Is there anything else?

## 2017-11-25 DIAGNOSIS — F4323 Adjustment disorder with mixed anxiety and depressed mood: Secondary | ICD-10-CM | POA: Diagnosis not present

## 2017-11-25 NOTE — Telephone Encounter (Signed)
Patient called back, he was driving and wanted me to call and leave a voicemail with the information. I have called him and left the phone number and also let him know that the co-pay card he gets needs to be activated first and to start with it since he should only have to pay $10.

## 2017-11-25 NOTE — Telephone Encounter (Signed)
He is concern abt price of entresto. That is the best med for him, he can try to call [employer] [phone],  And see if they can help

## 2017-11-25 NOTE — Telephone Encounter (Signed)
Left message to call back  

## 2017-12-02 DIAGNOSIS — F4323 Adjustment disorder with mixed anxiety and depressed mood: Secondary | ICD-10-CM | POA: Diagnosis not present

## 2017-12-03 ENCOUNTER — Encounter: Payer: Self-pay | Admitting: Cardiology

## 2017-12-03 ENCOUNTER — Ambulatory Visit: Payer: Federal, State, Local not specified - PPO | Admitting: Cardiology

## 2017-12-03 VITALS — BP 126/82 | HR 95 | Ht 68.25 in | Wt 194.0 lb

## 2017-12-03 DIAGNOSIS — I447 Left bundle-branch block, unspecified: Secondary | ICD-10-CM

## 2017-12-03 DIAGNOSIS — I10 Essential (primary) hypertension: Secondary | ICD-10-CM | POA: Diagnosis not present

## 2017-12-03 DIAGNOSIS — I420 Dilated cardiomyopathy: Secondary | ICD-10-CM

## 2017-12-03 MED ORDER — SACUBITRIL-VALSARTAN 49-51 MG PO TABS: 1.0000 | ORAL_TABLET | Freq: Two times a day (BID) | ORAL | 1 refills | Status: DC

## 2017-12-03 NOTE — Patient Instructions (Addendum)
Medication Instructions:  Your physician recommends that you continue on your current medications as directed. Please refer to the Current Medication list given to you today.  Labwork: None ordered  Testing/Procedures: None ordered  Follow-Up: Your physician recommends that you schedule a follow-up appointment in: 5 months with Dr. Krasowski in [place]   Any Other Special Instructions Will Be Listed Below (If Applicable).     If you need a refill on your cardiac medications before your next appointment, please call your pharmacy.   

## 2017-12-03 NOTE — Progress Notes (Signed)
Cardiology Office Note:    Date:  12/03/2017   ID:  [name], DOB 04-14-1956, MRN [id number]  PCP:  [name], MD  Cardiologist:  [name], MD    Referring MD: [name], MD   Chief Complaint  Patient presents with  . Follow-up  Doing well denies have any chest pain tightness squeezing pressure burning chest  History of Present Illness:    Sean Wells is a 62 y.o. male with cardiomyopathy.  His ejection fraction originally 30-35% but now improvement to 45%.  He is on beta-blocker as well was on Entresto.  I will continue with those medications.  He reports to have some dizzy spell when he gets up very quickly in the medevac one time he passed out we had a long discussion about this situation and I told him as long as he is aware of his potential symptoms when he gets up also trying to get up rather slow and also take extra fluids every day then we should be fine if not will be forced to cut down some medication which I prefer not to do because so far this medication seems to be working well his ejection fraction improved significantly.  He is willing to continue with present dosages however he will let me know if he will have some more symptoms and would like to modify his meds.  Past Medical History:  Diagnosis Date  . Anxiety   . Hyperlipidemia   . Hypertension     Past Surgical History:  Procedure Laterality Date  . HERNIA REPAIR    . Torn tendon and patella      Current Medications: Current Meds  Medication Sig  . atorvastatin (LIPITOR) 20 MG tablet   . carvedilol (COREG) 12.5 MG tablet Take 1 tablet (12.5 mg total) by mouth 2 (two) times daily.  . Multiple Vitamin (MULTIVITAMIN) tablet Take 1 tablet by mouth daily.  Marland Kitchen PARoxetine (PAXIL) 30 MG tablet Take 30 mg by mouth daily.  . sacubitril-valsartan (ENTRESTO) 49-51 MG Take 1 tablet by mouth 2 (two) times daily.  . [DISCONTINUED] sacubitril-valsartan (ENTRESTO) 49-51 MG Take 1 tablet by mouth 2  (two) times daily.     Allergies:   Patient has no known allergies.   Social History   Socioeconomic History  . Marital status: Married    Spouse name: None  . Number of children: None  . Years of education: None  . Highest education level: None  Social Needs  . Financial resource strain: None  . Food insecurity - worry: None  . Food insecurity - inability: None  . Transportation needs - medical: None  . Transportation needs - non-medical: None  Occupational History  . None  Tobacco Use  . Smoking status: Never Smoker  . Smokeless tobacco: Current User  Substance and Sexual Activity  . Alcohol use: Yes  . Drug use: No  . Sexual activity: None  Other Topics Concern  . None  Social History Narrative  . None     Family History: The patient's family history includes Cancer in his father; Heart failure in his mother. ROS:   Please see the history of present illness.    All 14 point review of systems negative except as described per history of present illness  EKGs/Labs/Other Studies Reviewed:      Recent Labs: 07/28/2017: Hemoglobin 16.3; Platelets 270 11/02/2017: BUN 17; Creatinine, Ser 1.24; Potassium 4.4; Sodium 139  Recent Lipid Panel No results found for: CHOL, TRIG, HDL, CHOLHDL, VLDL,  LDLCALC, LDLDIRECT  Physical Exam:    VS:  BP 126/82 (BP Location: Right Arm, Patient Position: Sitting, Cuff Size: Normal)   Pulse 95   Ht 5' 8.25" (1.734 m)   Wt 194 lb (88 kg)   SpO2 95%   BMI 29.28 kg/m     Wt Readings from Last 3 Encounters:  12/03/17 194 lb (88 kg)  11/02/17 192 lb (87.1 kg)  10/02/17 191 lb 12.8 oz (87 kg)     GEN:  Well nourished, well developed in no acute distress HEENT: Normal NECK: No JVD; No carotid bruits LYMPHATICS: No lymphadenopathy CARDIAC: RRR, no murmurs, no rubs, no gallops RESPIRATORY:  Clear to auscultation without rales, wheezing or rhonchi  ABDOMEN: Soft, non-tender, non-distended MUSCULOSKELETAL:  No edema; No deformity    SKIN: Warm and dry LOWER EXTREMITIES: no swelling NEUROLOGIC:  Alert and oriented x 3 PSYCHIATRIC:  Normal affect   ASSESSMENT:    1. Essential hypertension   2. Dilated cardiomyopathy (HCC)   3. Left bundle branch block (LBBB)    PLAN:    In order of problems listed above:  1. Essential hypertension: Blood pressure well controlled continue present medications. 2. Relative cardiomyopathy: Plan as outlined above. 3. Left bundle branch block: His ejection fraction improved significantly so far we have no indication to proceed with BiV pacing for somebody with ejection fraction 45% but that something that may change in the future.  He actually asked me to question if putting pacemaker will make any difference.  I told him for now in 2019 there is no indication but in the future it may change.   Medication Adjustments/Labs and Tests Ordered: Current medicines are reviewed at length with the patient today.  Concerns regarding medicines are outlined above.  No orders of the defined types were placed in this encounter.  Medication changes:  Meds ordered this encounter  Medications  . sacubitril-valsartan (ENTRESTO) 49-51 MG    Sig: Take 1 tablet by mouth 2 (two) times daily.    Dispense:  180 tablet    Refill:  1    Signed, Georgeanna Leaobert J. Krasowski, MD, [facility] 12/03/2017 12:33 PM    [facility]

## 2017-12-17 DIAGNOSIS — F4323 Adjustment disorder with mixed anxiety and depressed mood: Secondary | ICD-10-CM | POA: Diagnosis not present

## 2017-12-23 DIAGNOSIS — F4323 Adjustment disorder with mixed anxiety and depressed mood: Secondary | ICD-10-CM | POA: Diagnosis not present

## 2017-12-24 DIAGNOSIS — F4323 Adjustment disorder with mixed anxiety and depressed mood: Secondary | ICD-10-CM | POA: Diagnosis not present

## 2017-12-30 DIAGNOSIS — F4323 Adjustment disorder with mixed anxiety and depressed mood: Secondary | ICD-10-CM | POA: Diagnosis not present

## 2017-12-31 DIAGNOSIS — F4323 Adjustment disorder with mixed anxiety and depressed mood: Secondary | ICD-10-CM | POA: Diagnosis not present

## 2018-01-06 DIAGNOSIS — F4323 Adjustment disorder with mixed anxiety and depressed mood: Secondary | ICD-10-CM | POA: Diagnosis not present

## 2018-01-14 ENCOUNTER — Telehealth: Payer: Self-pay | Admitting: Cardiology

## 2018-01-14 MED ORDER — SACUBITRIL-VALSARTAN 49-51 MG PO TABS: 1.0000 | ORAL_TABLET | Freq: Two times a day (BID) | ORAL | 2 refills | Status: DC

## 2018-01-14 NOTE — Telephone Encounter (Signed)
[facility] [place]

## 2018-01-14 NOTE — Telephone Encounter (Signed)
[place]

## 2018-01-14 NOTE — Telephone Encounter (Signed)
Please sent refill of Entresto to [place] [facility]

## 2018-01-20 DIAGNOSIS — F4323 Adjustment disorder with mixed anxiety and depressed mood: Secondary | ICD-10-CM | POA: Diagnosis not present

## 2018-02-10 DIAGNOSIS — E782 Mixed hyperlipidemia: Secondary | ICD-10-CM | POA: Diagnosis not present

## 2018-02-10 DIAGNOSIS — F419 Anxiety disorder, unspecified: Secondary | ICD-10-CM | POA: Diagnosis not present

## 2018-02-10 DIAGNOSIS — I10 Essential (primary) hypertension: Secondary | ICD-10-CM | POA: Diagnosis not present

## 2018-02-10 DIAGNOSIS — I428 Other cardiomyopathies: Secondary | ICD-10-CM | POA: Diagnosis not present

## 2018-05-14 DIAGNOSIS — M7989 Other specified soft tissue disorders: Secondary | ICD-10-CM | POA: Diagnosis not present

## 2018-05-14 DIAGNOSIS — E782 Mixed hyperlipidemia: Secondary | ICD-10-CM | POA: Diagnosis not present

## 2018-05-14 DIAGNOSIS — E663 Overweight: Secondary | ICD-10-CM | POA: Diagnosis not present

## 2018-05-14 DIAGNOSIS — I428 Other cardiomyopathies: Secondary | ICD-10-CM | POA: Diagnosis not present

## 2018-05-14 DIAGNOSIS — I10 Essential (primary) hypertension: Secondary | ICD-10-CM | POA: Diagnosis not present

## 2018-05-14 DIAGNOSIS — S6991XA Unspecified injury of right wrist, hand and finger(s), initial encounter: Secondary | ICD-10-CM | POA: Diagnosis not present

## 2018-05-14 DIAGNOSIS — M79644 Pain in right finger(s): Secondary | ICD-10-CM | POA: Diagnosis not present

## 2018-05-27 ENCOUNTER — Ambulatory Visit: Payer: Federal, State, Local not specified - PPO | Admitting: Cardiology

## 2018-05-27 ENCOUNTER — Encounter: Payer: Self-pay | Admitting: Cardiology

## 2018-05-27 VITALS — BP 128/72 | HR 83 | Ht 68.25 in | Wt 190.2 lb

## 2018-05-27 DIAGNOSIS — I517 Cardiomegaly: Secondary | ICD-10-CM | POA: Diagnosis not present

## 2018-05-27 DIAGNOSIS — I10 Essential (primary) hypertension: Secondary | ICD-10-CM | POA: Diagnosis not present

## 2018-05-27 DIAGNOSIS — I420 Dilated cardiomyopathy: Secondary | ICD-10-CM | POA: Diagnosis not present

## 2018-05-27 DIAGNOSIS — I447 Left bundle-branch block, unspecified: Secondary | ICD-10-CM | POA: Diagnosis not present

## 2018-05-27 NOTE — Patient Instructions (Signed)
Medication Instructions:  Your physician recommends that you continue on your current medications as directed. Please refer to the Current Medication list given to you today.  Labwork: Your physician recommends that you have the following labs drawn:   Testing/Procedures: Your physician recommends that you have the following labs drawn: TSH, BMP, D3, B12, and Lipid panel.  Follow-Up: Your physician recommends that you schedule a follow-up appointment in: 3 months  Any Other Special Instructions Will Be Listed Below (If Applicable).     If you need a refill on your cardiac medications before your next appointment, please call your pharmacy.   [facility]  Garey HamAshley A, RN, BSN

## 2018-05-27 NOTE — Progress Notes (Signed)
Cardiology Office Note:    Date:  05/27/2018   ID:  Sean Wells, DOB [birth year], MRN [id number]  PCP:  Alinda DeemPenner, Pamela, MD  Cardiologist:  Gypsy Balsamobert Cindia Hustead, MD doing well  Referring MD: Alinda DeemPenner, Pamela, MD   No chief complaint on file. Doing well  History of Present Illness:    Sean Wells is a 62 y.o. male with nonischemic cardiomyopathy latest echocardiogram showed ejection fraction 45%.  He is on Entresto as well as carvedilol which I will continue.  Recently his [name] has been increased and he did feel weak tired exhausted but slowly gradually getting better.  No chest pain tightness squeezing pressure burning chest still exercise on the regular basis  Past Medical History:  Diagnosis Date  . Anxiety   . Hyperlipidemia   . Hypertension     Past Surgical History:  Procedure Laterality Date  . HERNIA REPAIR    . Torn tendon and patella      Current Medications: Current Meds  Medication Sig  . atorvastatin (LIPITOR) 20 MG tablet Take 60 mg by mouth daily at 6 PM.   . Multiple Vitamin (MULTIVITAMIN) tablet Take 1 tablet by mouth daily.  Marland Kitchen. PARoxetine (PAXIL) 30 MG tablet Take 30 mg by mouth daily.  . sacubitril-valsartan (ENTRESTO) 49-51 MG Take 1 tablet by mouth 2 (two) times daily.     Allergies:   Patient has no known allergies.   Social History   Socioeconomic History  . Marital status: Married    Spouse name: Not on file  . Number of children: Not on file  . Years of education: Not on file  . Highest education level: Not on file  Occupational History  . Not on file  Social Needs  . Financial resource strain: Not on file  . Food insecurity:    Worry: Not on file    Inability: Not on file  . Transportation needs:    Medical: Not on file    Non-medical: Not on file  Tobacco Use  . Smoking status: Never Smoker  . Smokeless tobacco: Current User  Substance and Sexual Activity  . Alcohol use: Yes  . Drug use: No  . Sexual activity: Not on file    Lifestyle  . Physical activity:    Days per week: Not on file    Minutes per session: Not on file  . Stress: Not on file  Relationships  . Social connections:    Talks on phone: Not on file    Gets together: Not on file    Attends religious service: Not on file    Active member of club or organization: Not on file    Attends meetings of clubs or organizations: Not on file    Relationship status: Not on file  Other Topics Concern  . Not on file  Social History Narrative  . Not on file     Family History: The patient's family history includes Cancer in his father; Heart failure in his mother. ROS:   Please see the history of present illness.    All 14 point review of systems negative except as described per history of present illness  EKGs/Labs/Other Studies Reviewed:      Recent Labs: 07/28/2017: Hemoglobin 16.3; Platelets 270 11/02/2017: BUN 17; Creatinine, Ser 1.24; Potassium 4.4; Sodium 139  Recent Lipid Panel No results found for: CHOL, TRIG, HDL, CHOLHDL, VLDL, LDLCALC, LDLDIRECT  Physical Exam:    VS:  BP 128/72 (BP Location: Right Arm, Patient Position: Sitting, Cuff Size:  Normal)   Pulse 83   Ht 5' 8.25" (1.734 m)   Wt 190 lb 3.2 oz (86.3 kg)   SpO2 97%   BMI 28.71 kg/m     Wt Readings from Last 3 Encounters:  05/27/18 190 lb 3.2 oz (86.3 kg)  12/03/17 194 lb (88 kg)  11/02/17 192 lb (87.1 kg)     GEN:  Well nourished, well developed in no acute distress HEENT: Normal NECK: No JVD; No carotid bruits LYMPHATICS: No lymphadenopathy CARDIAC: RRR, no murmurs, no rubs, no gallops RESPIRATORY:  Clear to auscultation without rales, wheezing or rhonchi  ABDOMEN: Soft, non-tender, non-distended MUSCULOSKELETAL:  No edema; No deformity  SKIN: Warm and dry LOWER EXTREMITIES: no swelling NEUROLOGIC:  Alert and oriented x 3 PSYCHIATRIC:  Normal affect   ASSESSMENT:    1. Dilated cardiomyopathy (HCC)   2. Cardiomegaly   3. Essential hypertension   4.  Left bundle branch block (LBBB)    PLAN:    In order of problems listed above:  1. Cardiomyopathy on Entresto as well as beta-blocker which I will continue. 2. Chronic left bundle branch block noted.  We will continue present management. 3. Essential hypertension blood pressure well controlled.  We will continue present management.  Overall he is doing good however complain of being weak tired and exhausted we will check his TSH B12 as well as vitamin D.  I will also check his fasting lipid profile as well as Chem-7 today.   Medication Adjustments/Labs and Tests Ordered: Current medicines are reviewed at length with the patient today.  Concerns regarding medicines are outlined above.  No orders of the defined types were placed in this encounter.  Medication changes: No orders of the defined types were placed in this encounter.   Signed, Georgeanna Leaobert J. Alyzabeth Pontillo, MD, [facility] 05/27/2018 3:29 PM    [facility]

## 2018-05-31 LAB — BASIC METABOLIC PANEL
BUN / CREAT RATIO: 18 (ref 10–24)
BUN: 20 mg/dL (ref 8–27)
CHLORIDE: 102 mmol/L (ref 96–106)
CO2: 26 mmol/L (ref 20–29)
Calcium: 9.8 mg/dL (ref 8.6–10.2)
Creatinine, Ser: 1.12 mg/dL (ref 0.76–1.27)
GFR, EST AFRICAN AMERICAN: 82 mL/min/{1.73_m2} (ref >59)
GFR, EST NON AFRICAN AMERICAN: 71 mL/min/{1.73_m2} (ref >59)
Glucose: 89 mg/dL (ref 65–99)
Potassium: 4.6 mmol/L (ref 3.5–5.2)
Sodium: 141 mmol/L (ref 134–144)

## 2018-05-31 LAB — VITAMIN D 1,25 DIHYDROXY
Vitamin D 1, 25 (OH)2 Total: 53 pg/mL
Vitamin D2 1, 25 (OH)2: <10 pg/mL
Vitamin D3 1, 25 (OH)2: 53 pg/mL

## 2018-05-31 LAB — LIPID PANEL
CHOL/HDL RATIO: 2.6 ratio (ref 0.0–5.0)
Cholesterol, Total: 180 mg/dL (ref 100–199)
HDL: 70 mg/dL (ref >39)
LDL CALC: 96 mg/dL (ref 0–99)
Triglycerides: 68 mg/dL (ref 0–149)
VLDL CHOLESTEROL CAL: 14 mg/dL (ref 5–40)

## 2018-05-31 LAB — VITAMIN B12: VITAMIN B 12: 1068 pg/mL (ref 232–1245)

## 2018-05-31 LAB — TSH: TSH: 1.27 u[IU]/mL (ref 0.450–4.500)

## 2018-06-03 ENCOUNTER — Other Ambulatory Visit: Payer: Self-pay | Admitting: Cardiology

## 2018-06-03 NOTE — Progress Notes (Signed)
All labs are looking normal

## 2018-08-27 ENCOUNTER — Encounter: Payer: Self-pay | Admitting: Cardiology

## 2018-08-27 ENCOUNTER — Ambulatory Visit: Payer: Federal, State, Local not specified - PPO | Admitting: Cardiology

## 2018-08-27 VITALS — BP 118/68 | HR 67 | Ht 68.25 in | Wt 197.6 lb

## 2018-08-27 DIAGNOSIS — R0789 Other chest pain: Secondary | ICD-10-CM

## 2018-08-27 DIAGNOSIS — I447 Left bundle-branch block, unspecified: Secondary | ICD-10-CM

## 2018-08-27 DIAGNOSIS — I420 Dilated cardiomyopathy: Secondary | ICD-10-CM

## 2018-08-27 DIAGNOSIS — I517 Cardiomegaly: Secondary | ICD-10-CM

## 2018-08-27 DIAGNOSIS — R079 Chest pain, unspecified: Secondary | ICD-10-CM

## 2018-08-27 DIAGNOSIS — I10 Essential (primary) hypertension: Secondary | ICD-10-CM

## 2018-08-27 NOTE — Progress Notes (Signed)
Cardiology Office Note:    Date:  08/27/2018   ID:  Sean Wells, DOB Mar 30, 1956, MRN [id number]  PCP:  [name], MD  Cardiologist:  [name], MD    Referring MD: [name], MD   Chief Complaint  Patient presents with  . Follow-up  Doing well but stressed out have chest pain  History of Present Illness:    Sean Wells is a 62 y.o. male with past medical history significant for cardiomyopathy left bundle branch block improvement with appropriate medication that he is on latest estimation of ejection fraction was 45% he complained of having some chest pain which is not related to exercise.  It is related to stress he exercised on the regular basis however his exercise involve weightlifting no aerobic exercise.  We had a long discussion about what to do with the situation.  Past Medical History:  Diagnosis Date  . Anxiety   . Hyperlipidemia   . Hypertension     Past Surgical History:  Procedure Laterality Date  . HERNIA REPAIR    . Torn tendon and patella      Current Medications: Current Meds  Medication Sig  . atorvastatin (LIPITOR) 20 MG tablet Take 20 mg by mouth daily at 6 PM.   . carvedilol (COREG) 12.5 MG tablet TAKE 1 TABLET BY MOUTH TWICE DAILY  . ENTRESTO 97-103 MG Take 1 tablet by mouth 2 (two) times daily.  . Multiple Vitamin (MULTIVITAMIN) tablet Take 1 tablet by mouth daily.  Marland Kitchen PARoxetine (PAXIL) 30 MG tablet Take 30 mg by mouth daily.     Allergies:   Patient has no known allergies.   Social History   Socioeconomic History  . Marital status: Married    Spouse name: Not on file  . Number of children: Not on file  . Years of education: Not on file  . Highest education level: Not on file  Occupational History  . Not on file  Social Needs  . Financial resource strain: Not on file  . Food insecurity:    Worry: Not on file    Inability: Not on file  . Transportation needs:    Medical: Not on file    Non-medical: Not on file    Tobacco Use  . Smoking status: Never Smoker  . Smokeless tobacco: Current User  Substance and Sexual Activity  . Alcohol use: Yes  . Drug use: No  . Sexual activity: Not on file  Lifestyle  . Physical activity:    Days per week: Not on file    Minutes per session: Not on file  . Stress: Not on file  Relationships  . Social connections:    Talks on phone: Not on file    Gets together: Not on file    Attends religious service: Not on file    Active member of club or organization: Not on file    Attends meetings of clubs or organizations: Not on file    Relationship status: Not on file  Other Topics Concern  . Not on file  Social History Narrative  . Not on file     Family History: The patient's family history includes Cancer in his father; Heart failure in his mother. ROS:   Please see the history of present illness.    All 14 point review of systems negative except as described per history of present illness  EKGs/Labs/Other Studies Reviewed:      Recent Labs: 05/27/2018: BUN 20; Creatinine, Ser 1.12; Potassium 4.6; Sodium  141; TSH 1.270  Recent Lipid Panel    Component Value Date/Time   CHOL 180 05/27/2018 1544   TRIG 68 05/27/2018 1544   HDL 70 05/27/2018 1544   CHOLHDL 2.6 05/27/2018 1544   LDLCALC 96 05/27/2018 1544    Physical Exam:    VS:  BP 118/68   Pulse 67   Ht 5' 8.25" (1.734 m)   Wt 197 lb 9.6 oz (89.6 kg)   SpO2 98%   BMI 29.83 kg/m     Wt Readings from Last 3 Encounters:  08/27/18 197 lb 9.6 oz (89.6 kg)  05/27/18 190 lb 3.2 oz (86.3 kg)  12/03/17 194 lb (88 kg)     GEN:  Well nourished, well developed in no acute distress HEENT: Normal NECK: No JVD; No carotid bruits LYMPHATICS: No lymphadenopathy CARDIAC: RRR, no murmurs, no rubs, no gallops RESPIRATORY:  Clear to auscultation without rales, wheezing or rhonchi  ABDOMEN: Soft, non-tender, non-distended MUSCULOSKELETAL:  No edema; No deformity  SKIN: Warm and dry LOWER  EXTREMITIES: no swelling NEUROLOGIC:  Alert and oriented x 3 PSYCHIATRIC:  Normal affect   ASSESSMENT:    1. Left bundle branch block (LBBB)   2. Atypical chest pain   3. Essential hypertension   4. Cardiomegaly   5. Dilated cardiomyopathy (HCC)    PLAN:    In order of problems listed above:  1. Left bundle branch block chronic we will continue present management. 2. Atypical chest pain he will be scheduled to have stress test.  If he still have left bundle branch block which I anticipate he will will be forced to do Lexiscan will try to walking the treadmill and get Lexiscan done as well. 3. Essential hypertension blood pressure well controlled continue present management. 4. Cardiomyopathy which is dilated with improvement left ventricular ejection fraction beta-blocker as well as Entresto which I will continue.   Medication Adjustments/Labs and Tests Ordered: Current medicines are reviewed at length with the patient today.  Concerns regarding medicines are outlined above.  No orders of the defined types were placed in this encounter.  Medication changes: No orders of the defined types were placed in this encounter.   Signed, Georgeanna Leaobert J. Johnny Gorter, MD, [facility] 08/27/2018 11:48 AM    [facility]

## 2018-08-27 NOTE — Patient Instructions (Signed)
Medication Instructions:  Your physician recommends that you continue on your current medications as directed. Please refer to the Current Medication list given to you today.  If you need a refill on your cardiac medications before your next appointment, please call your pharmacy.   Lab work: None ordered If you have labs (blood work) drawn today and your tests are completely normal, you will receive your results only by: Marland Kitchen. MyChart Message (if you have MyChart) OR . A paper copy in the mail If you have any lab test that is abnormal or we need to change your treatment, we will call you to review the results.  Testing/Procedures: Your physician has requested that you have a lexiscan myoview. For further information please visit [email or web address]. Please follow instruction sheet, as given.  Follow-Up: At [facility], you and your health needs are our priority.  As part of our continuing mission to provide you with exceptional heart care, we have created designated Provider Care Teams.  These Care Teams include your primary Cardiologist (physician) and Advanced Practice Providers (APPs -  Physician Assistants and Nurse Practitioners) who all work together to provide you with the care you need, when you need it. You will need a follow up appointment in 6 months.  Please call our office 2 months in advance to schedule this appointment.  You may see Gypsy Balsamobert Krasowski or another member of our [facility] Provider Team in [place]: Norman HerrlichBrian Munley, MD . Belva Cromeajan Revankar, MD  Any Other Special Instructions Will Be Listed Below (If Applicable).

## 2018-10-07 ENCOUNTER — Telehealth (HOSPITAL_COMMUNITY): Payer: Self-pay | Admitting: *Deleted

## 2018-10-07 NOTE — Telephone Encounter (Signed)
Left message on voicemail per DPR in reference to upcoming appointment scheduled on 10/12/18 with detailed instructions given per Myocardial Perfusion Study Information Sheet for the test. LM to arrive 15 minutes early, and that it is imperative to arrive on time for appointment to keep from having the test rescheduled. If you need to cancel or reschedule your appointment, please call the office within 24 hours of your appointment. Failure to do so may result in a cancellation of your appointment, and a $50 no show fee. Phone number given for call back for any questions. Sean Wells Jacqueline    

## 2018-10-16 DIAGNOSIS — J324 Chronic pansinusitis: Secondary | ICD-10-CM | POA: Diagnosis not present

## 2018-10-27 ENCOUNTER — Telehealth: Payer: Self-pay | Admitting: *Deleted

## 2018-10-27 NOTE — Telephone Encounter (Signed)
Patient given detailed instructions per Myocardial Perfusion Study Information Sheet for the test on 11/03/18 at 1100. Patient notified to arrive 15 minutes early and that it is imperative to arrive on time for appointment to keep from having the test rescheduled.  If you need to cancel or reschedule your appointment, please call the office within 24 hours of your appointment. . Patient verbalized understanding.Manny Vitolo, Adelene Idler

## 2018-11-15 DIAGNOSIS — E663 Overweight: Secondary | ICD-10-CM | POA: Diagnosis not present

## 2018-11-15 DIAGNOSIS — E782 Mixed hyperlipidemia: Secondary | ICD-10-CM | POA: Diagnosis not present

## 2018-11-15 DIAGNOSIS — Z6827 Body mass index (BMI) 27.0-27.9, adult: Secondary | ICD-10-CM | POA: Diagnosis not present

## 2018-11-15 DIAGNOSIS — I10 Essential (primary) hypertension: Secondary | ICD-10-CM | POA: Diagnosis not present

## 2018-11-29 ENCOUNTER — Other Ambulatory Visit: Payer: Self-pay | Admitting: Cardiology

## 2019-01-01 ENCOUNTER — Emergency Department (HOSPITAL_COMMUNITY): Payer: Federal, State, Local not specified - PPO

## 2019-01-01 ENCOUNTER — Emergency Department (HOSPITAL_COMMUNITY)
Admission: EM | Admit: 2019-01-01 | Discharge: 2019-01-01 | Disposition: A | Discharge status: Home / Self Care | Payer: Federal, State, Local not specified - PPO | Attending: Emergency Medicine | Admitting: Emergency Medicine

## 2019-01-01 ENCOUNTER — Other Ambulatory Visit: Payer: Self-pay

## 2019-01-01 ENCOUNTER — Encounter (HOSPITAL_COMMUNITY): Payer: Self-pay | Admitting: Emergency Medicine

## 2019-01-01 DIAGNOSIS — Z79899 Other long term (current) drug therapy: Secondary | ICD-10-CM | POA: Insufficient documentation

## 2019-01-01 DIAGNOSIS — I10 Essential (primary) hypertension: Secondary | ICD-10-CM | POA: Diagnosis not present

## 2019-01-01 DIAGNOSIS — E785 Hyperlipidemia, unspecified: Secondary | ICD-10-CM | POA: Diagnosis not present

## 2019-01-01 DIAGNOSIS — R112 Nausea with vomiting, unspecified: Secondary | ICD-10-CM

## 2019-01-01 DIAGNOSIS — R109 Unspecified abdominal pain: Secondary | ICD-10-CM

## 2019-01-01 DIAGNOSIS — R1084 Generalized abdominal pain: Secondary | ICD-10-CM | POA: Diagnosis not present

## 2019-01-01 DIAGNOSIS — R10A1 Flank pain, right side: Secondary | ICD-10-CM

## 2019-01-01 DIAGNOSIS — R1110 Vomiting, unspecified: Secondary | ICD-10-CM | POA: Diagnosis not present

## 2019-01-01 DIAGNOSIS — N201 Calculus of ureter: Secondary | ICD-10-CM

## 2019-01-01 DIAGNOSIS — N132 Hydronephrosis with renal and ureteral calculous obstruction: Secondary | ICD-10-CM | POA: Diagnosis not present

## 2019-01-01 DIAGNOSIS — R197 Diarrhea, unspecified: Secondary | ICD-10-CM | POA: Insufficient documentation

## 2019-01-01 DIAGNOSIS — R944 Abnormal results of kidney function studies: Secondary | ICD-10-CM | POA: Diagnosis not present

## 2019-01-01 DIAGNOSIS — I447 Left bundle-branch block, unspecified: Secondary | ICD-10-CM | POA: Insufficient documentation

## 2019-01-01 DIAGNOSIS — F17290 Nicotine dependence, other tobacco product, uncomplicated: Secondary | ICD-10-CM | POA: Insufficient documentation

## 2019-01-01 DIAGNOSIS — R7989 Other specified abnormal findings of blood chemistry: Secondary | ICD-10-CM

## 2019-01-01 DIAGNOSIS — Z7982 Long term (current) use of aspirin: Secondary | ICD-10-CM | POA: Diagnosis not present

## 2019-01-01 LAB — URINALYSIS, ROUTINE W REFLEX MICROSCOPIC
Bilirubin Urine: NEGATIVE
Glucose, UA: NEGATIVE mg/dL
Ketones, ur: 5 mg/dL — AB
Leukocytes,Ua: NEGATIVE
NITRITE: NEGATIVE
Protein, ur: 30 mg/dL — AB
Specific Gravity, Urine: >1.046 — ABNORMAL HIGH (ref 1.005–1.030)
pH: 6 (ref 5.0–8.0)

## 2019-01-01 LAB — COMPREHENSIVE METABOLIC PANEL
ALT: 26 U/L (ref 0–44)
AST: 24 U/L (ref 15–41)
Albumin: 4.2 g/dL (ref 3.5–5.0)
Alkaline Phosphatase: 63 U/L (ref 38–126)
Anion gap: 12 (ref 5–15)
BUN: 18 mg/dL (ref 8–23)
CO2: 24 mmol/L (ref 22–32)
Calcium: 9.8 mg/dL (ref 8.9–10.3)
Chloride: 103 mmol/L (ref 98–111)
Creatinine, Ser: 1.96 mg/dL — ABNORMAL HIGH (ref 0.61–1.24)
GFR calc Af Amer: 41 mL/min — ABNORMAL LOW (ref >60)
GFR calc non Af Amer: 36 mL/min — ABNORMAL LOW (ref >60)
Glucose, Bld: 161 mg/dL — ABNORMAL HIGH (ref 70–99)
POTASSIUM: 3.8 mmol/L (ref 3.5–5.1)
Sodium: 139 mmol/L (ref 135–145)
TOTAL PROTEIN: 7.1 g/dL (ref 6.5–8.1)
Total Bilirubin: 2.5 mg/dL — ABNORMAL HIGH (ref 0.3–1.2)

## 2019-01-01 LAB — CBC
HCT: 47.4 % (ref 39.0–52.0)
HEMOGLOBIN: 15.8 g/dL (ref 13.0–17.0)
MCH: 33.2 pg (ref 26.0–34.0)
MCHC: 33.3 g/dL (ref 30.0–36.0)
MCV: 99.6 fL (ref 80.0–100.0)
Platelets: 268 10*3/uL (ref 150–400)
RBC: 4.76 MIL/uL (ref 4.22–5.81)
RDW: 13.1 % (ref 11.5–15.5)
WBC: 13.4 10*3/uL — AB (ref 4.0–10.5)
nRBC: 0 % (ref 0.0–0.2)

## 2019-01-01 LAB — LIPASE, BLOOD: Lipase: 32 U/L (ref 11–51)

## 2019-01-01 MED ORDER — IOHEXOL 300 MG/ML  SOLN
75.0000 mL | Freq: Once | INTRAMUSCULAR | Status: AC | PRN
  Administered 2019-01-01: 75 mL via INTRAVENOUS

## 2019-01-01 MED ORDER — MORPHINE SULFATE (PF) 4 MG/ML IV SOLN
4.0000 mg | Freq: Once | INTRAVENOUS | Status: AC
  Administered 2019-01-01: 4 mg via INTRAVENOUS
  Filled 2019-01-01: qty 1

## 2019-01-01 MED ORDER — SODIUM CHLORIDE 0.9% FLUSH: 3.0000 mL | Freq: Once | INTRAVENOUS | Status: DC

## 2019-01-01 MED ORDER — HYDROCODONE-ACETAMINOPHEN 5-325 MG PO TABS: 1.0000 | ORAL_TABLET | Freq: Four times a day (QID) | ORAL | 0 refills | Status: DC | PRN

## 2019-01-01 MED ORDER — KETOROLAC TROMETHAMINE 30 MG/ML IJ SOLN
15.0000 mg | Freq: Once | INTRAMUSCULAR | Status: AC
  Administered 2019-01-01: 15 mg via INTRAVENOUS
  Filled 2019-01-01: qty 1

## 2019-01-01 MED ORDER — SODIUM CHLORIDE 0.9 % IV BOLUS
500.0000 mL | Freq: Once | INTRAVENOUS | Status: AC
  Administered 2019-01-01: 500 mL via INTRAVENOUS

## 2019-01-01 MED ORDER — ONDANSETRON HCL 4 MG/2ML IJ SOLN: 4.0000 mg | Freq: Once | INTRAMUSCULAR | Status: DC | PRN

## 2019-01-01 MED ORDER — ONDANSETRON 4 MG PO TBDP: 4.0000 mg | ORAL_TABLET | Freq: Three times a day (TID) | ORAL | 0 refills | Status: DC | PRN

## 2019-01-01 MED ORDER — TAMSULOSIN HCL 0.4 MG PO CAPS: 0.4000 mg | ORAL_CAPSULE | Freq: Every day | ORAL | 0 refills | Status: AC

## 2019-01-01 MED ORDER — ONDANSETRON HCL 4 MG/2ML IJ SOLN
4.0000 mg | Freq: Once | INTRAMUSCULAR | Status: AC
  Administered 2019-01-01: 4 mg via INTRAVENOUS
  Filled 2019-01-01: qty 2

## 2019-01-01 MED ORDER — HYDROMORPHONE HCL 1 MG/ML IJ SOLN
1.0000 mg | Freq: Once | INTRAMUSCULAR | Status: AC
  Administered 2019-01-01: 1 mg via INTRAVENOUS
  Filled 2019-01-01: qty 1

## 2019-01-01 NOTE — Discharge Instructions (Signed)
1. Medications: Alternate 600 mg of ibuprofen and [id number] mg of Tylenol every 3 hours as needed for pain. Do not exceed 4000 mg of Tylenol daily.  Take ibuprofen with food to avoid upset stomach issues.    You can take hydrocodone as needed for severe pain but do not drive, drink alcohol, or operate heavy machinery while taking this medication as it may make you drowsy.  Be aware this medication also contains Tylenol.  It may also make you constipated, so you can take a stool softener with this to avoid constipation.    Take Zofran as needed for nausea.  Let this medicine dissolve under your tongue and wait around 10-15 minutes before eating or drinking after taking this medication to give it time to work.    Take Flomax as prescribed.  This medicine will open up the ureter(the tube connecting the bladder to the kidney) to allow for easier passage of the kidney stone.  Be aware this medication can cause a drop in your blood pressure so be careful when going from laying or sitting to standing as you may feel lightheaded.  If you feel very fatigued, lightheaded, or have persistently low blood pressures, stop taking this medication entirely. 2. Treatment: rest, drink plenty of fluids.  3. Follow Up: Please followup with urology as soon as possible (preferably this week) for repeat testing of your kidney function and discussion of your diagnoses and further evaluation after today's visit; Please return to the ER for persistent vomiting, high fevers or worsening symptoms

## 2019-01-01 NOTE — ED Notes (Signed)
Pt given ginger ale per [place], [place].

## 2019-01-01 NOTE — ED Provider Notes (Signed)
[facility] Provider Note   CSN: [id number] Arrival date & time: 01/01/19  [id number]    History   Chief Complaint No chief complaint on file.   HPI Sean BarthelWalter R Demarcus Jr. is a 63 y.o. male with history of anxiety, hypertension, hyperlipidemia, cardiomyopathy, left bundle branch block presents for evaluation of acute onset, progressively worsening right flank pain with nausea vomiting and diarrhea for 2 days.  Reports symptoms began after eating chicken at home but he will he believes it was prepared well and his wife who also ate the chicken does not have similar symptoms.  Reports sharp pain into the right flank which worsens with certain movements.  Has had several episodes of nonbloody nonbilious emesis and watery nonbloody diarrhea.  Denies fevers, chills, chest pain, shortness of breath, melena, or hematochezia.  Denies dysuria or hematuria but states his urine has been dark.  Endorses decreased appetite and decreased oral intake and has not been able to tolerate any p.o. fluids.  History of prior hernia repair.  Has not taken anything for his symptoms.     The history is provided by the patient.    Past Medical History:  Diagnosis Date   Anxiety    Hyperlipidemia    Hypertension     Patient Active Problem List   Diagnosis Date Noted   Cardiomegaly 08/12/2017   Hypertension 08/12/2017   Cardiomyopathy (HCC) ejection fraction 45% based on echocardiogram from 08/12/2017   Left bundle branch block (LBBB) 08/12/2017   Atypical chest pain 08/12/2017    Past Surgical History:  Procedure Laterality Date   HERNIA REPAIR     Torn tendon and patella          Home Medications    Prior to Admission medications   Medication Sig Start Date End Date Taking? Authorizing Provider  aspirin EC 81 MG tablet Take 81 mg by mouth daily.   Yes [provider]  atorvastatin (LIPITOR) 20 MG tablet Take 20 mg by mouth daily at 6 PM.   11/11/17  Yes [provider]  carvedilol (COREG) 12.5 MG tablet TAKE 1 TABLET BY MOUTH TWICE DAILY Patient taking differently: Take 12.5 mg by mouth 2 (two) times daily with a meal.  11/29/18  Yes Georgeanna LeaKrasowski, Robert J, MD  ENTRESTO 97-103 MG Take 1.5 tablets by mouth 2 (two) times daily.  06/23/18  Yes [provider]  Multiple Vitamin (MULTIVITAMIN) tablet Take 1 tablet by mouth daily.   Yes [provider]  PARoxetine (PAXIL) 30 MG tablet Take 30 mg by mouth daily. 04/27/17  Yes [provider]  HYDROcodone-acetaminophen (NORCO/VICODIN) 5-325 MG tablet Take 1 tablet by mouth every 6 (six) hours as needed for severe pain. 01/01/19   Luevenia MaxinFawze, Cartier Mapel A, PA-C  ondansetron (ZOFRAN ODT) 4 MG disintegrating tablet Take 1 tablet (4 mg total) by mouth every 8 (eight) hours as needed for nausea or vomiting. 01/01/19   Luevenia MaxinFawze, Sharonne Ricketts A, PA-C  tamsulosin (FLOMAX) 0.4 MG CAPS capsule Take 1 capsule (0.4 mg total) by mouth daily after supper for 5 days. 01/01/19 01/06/19  Jeanie SewerFawze, Estephani Popper A, PA-C    Family History Family History  Problem Relation Age of Onset   Heart failure Mother    Cancer Father     Social History Social History   Tobacco Use   Smoking status: Never Smoker   Smokeless tobacco: Current User  Substance Use Topics   Alcohol use: Yes   Drug use: No  Allergies   Patient has no known allergies.   Review of Systems Review of Systems  Constitutional: Negative for chills and fever.  Respiratory: Negative for shortness of breath.   Cardiovascular: Negative for chest pain.  Gastrointestinal: Positive for abdominal pain, diarrhea, nausea and vomiting.  Genitourinary: Positive for flank pain. Negative for dysuria and hematuria.  All other systems reviewed and are negative.    Physical Exam Updated Vital Signs BP 119/84    Pulse 75    Temp 98.3 F (36.8 C) (Oral)    Resp 16    Ht  (1.727 m)    Wt 83.9 kg    SpO2 93%    BMI 28.13 kg/m    Physical Exam Vitals signs and nursing note reviewed.  Constitutional:      General: He is not in acute distress.    Appearance: He is well-developed.  HENT:     Head: Normocephalic and atraumatic.  Eyes:     General:        Right eye: No discharge.        Left eye: No discharge.     Conjunctiva/sclera: Conjunctivae normal.  Neck:     Vascular: No JVD.     Trachea: No tracheal deviation.  Cardiovascular:     Rate and Rhythm: Normal rate and regular rhythm.  Pulmonary:     Effort: Pulmonary effort is normal.     Breath sounds: Normal breath sounds.  Abdominal:     General: Abdomen is flat. Bowel sounds are increased. There is no distension.     Palpations: Abdomen is soft.     Tenderness: There is abdominal tenderness. There is right CVA tenderness. There is no left CVA tenderness, guarding or rebound. Negative signs include Murphy's sign, Rovsing's sign, McBurney's sign and psoas sign.     Comments: Mild right flank tenderness  Skin:    General: Skin is warm and dry.     Findings: No erythema.  Neurological:     Mental Status: He is alert.  Psychiatric:        Behavior: Behavior normal.      ED Treatments / Results  Labs (all labs ordered are listed, but only abnormal results are displayed) Labs Reviewed  COMPREHENSIVE METABOLIC PANEL - Abnormal; Notable for the following components:      Result Value   Glucose, Bld 161 (*)    Creatinine, Ser 1.96 (*)    Total Bilirubin 2.5 (*)    GFR calc non Af Amer 36 (*)    GFR calc Af Amer 41 (*)    All other components within normal limits  CBC - Abnormal; Notable for the following components:   WBC 13.4 (*)    All other components within normal limits  URINALYSIS, ROUTINE W REFLEX MICROSCOPIC - Abnormal; Notable for the following components:   Specific Gravity, Urine >1.046 (*)    Hgb urine dipstick LARGE (*)    Ketones, ur 5 (*)    Protein, ur 30 (*)    Bacteria, UA RARE (*)    All other components within normal  limits  URINE CULTURE  LIPASE, BLOOD    EKG EKG Interpretation  Date/Time:  Saturday January 01 2019 05:11:30 EDT Ventricular Rate:  64 PR Interval:    QRS Duration: 154 QT Interval:  454 QTC Calculation: 469 R Axis:   -107 Text Interpretation:  Sinus rhythm Probable left atrial enlargement IVCD, consider atypical LBBB LBBB unchanged Confirmed by Marily Memos [phone]) on 01/01/2019 5:14:48  AM   Radiology Ct Abdomen Pelvis W Contrast  Result Date: 01/01/2019 CLINICAL DATA:  Abdominal pain 2 days with nausea and vomiting. Pain right-sided. EXAM: CT ABDOMEN AND PELVIS WITH CONTRAST TECHNIQUE: Multidetector CT imaging of the abdomen and pelvis was performed using the standard protocol following bolus administration of intravenous contrast. CONTRAST:  51mL OMNIPAQUE IOHEXOL 300 MG/ML  SOLN COMPARISON:  CT pelvis 03/19/2017 FINDINGS: Lower chest: Evidence of bibasilar atelectasis. No effusion. Mild cardiomegaly. Hepatobiliary: 1 cm hypodensity over the right lobe of the liver too small to characterize but likely a hemangioma. Gallbladder and biliary tree are within normal. Pancreas: Normal. Spleen: Normal. Adrenals/Urinary Tract: Adrenal glands are normal. Kidneys normal in size. There is mild delayed uptake of contrast by the right kidney with mild right-sided hydronephrosis and right perinephric fluid/inflammation. Mild dilatation of the proximal right ureter as there is a 4-5 mm stone over the right mid ureter causing this low-grade obstruction. No left renal stones. Left ureter and bladder are normal. Stomach/Bowel: Stomach and small bowel are within normal. Appendix is normal. Minimal diverticulosis of the colon most prominent over the sigmoid colon. Vascular/Lymphatic: Minimal calcified plaque over the abdominal aorta. No adenopathy. Reproductive: Normal.  Evidence of previous vasectomy. Other: Surgical clips over the left lower quadrant just deep to the abdominal wall. Musculoskeletal:  Degenerative change of the spine with disc disease at the L4-5 and L5-S1 levels. Mild degenerate change of the hips. Hypodensities over the vertebral bodies T11, L2 and S1 likely hemangiomas. IMPRESSION: 4-5 mm stone over the right mid ureter causing low-grade obstruction. Colonic diverticulosis. 1 cm hypodensity over the right lobe of the liver likely a hemangioma. Aortic Atherosclerosis (ICD10-I70.0). Electronically Signed   By: Elberta Fortis M.D.   On: 01/01/2019 07:49    Procedures Procedures (including critical care time)  Medications Ordered in ED Medications  sodium chloride flush (NS) 0.9 % injection 3 mL (has no administration in time range)  ondansetron (ZOFRAN) injection 4 mg (has no administration in time range)  morphine 4 MG/ML injection 4 mg (4 mg Intravenous Given 01/01/19 0616)  ondansetron (ZOFRAN) injection 4 mg (4 mg Intravenous Given 01/01/19 0616)  sodium chloride 0.9 % bolus 500 mL (0 mLs Intravenous Stopped 01/01/19 0645)  iohexol (OMNIPAQUE) 300 MG/ML solution 75 mL (75 mLs Intravenous Contrast Given 01/01/19 0721)  HYDROmorphone (DILAUDID) injection 1 mg (1 mg Intravenous Given 01/01/19 0747)  ketorolac (TORADOL) 30 MG/ML injection 15 mg (15 mg Intravenous Given 01/01/19 0847)  sodium chloride 0.9 % bolus 500 mL (500 mLs Intravenous New Bag/Given 01/01/19 1000)     Initial Impression / Assessment and Plan / ED Course  I have reviewed the triage vital signs and the nursing notes.  Pertinent labs & imaging results that were available during my care of the patient were reviewed by me and considered in my medical decision making (see chart for details).        Patient presenting for evaluation of right flank pain for 2 days.  He is afebrile, initially hypertensive with improvement while in the ED.  He is uncomfortable but nontoxic in appearance.  No peritoneal signs on examination of the abdomen.  Pain is primarily in the right flank.  Lab work reviewed by me shows  leukocytosis, no anemia, no metabolic derangements.  His creatinine is elevated but BUN within normal limits.  Also of note total bilirubin is elevated at 2.5 but remainder of LFTs are within normal limits and he has no evidence of cholecystitis on work-up  today so I am unsure of the clinical significance of this finding.  UA does not suggest UTI but does gross hematuria noted.  CT of the abdomen and pelvis significant for a 4 to 5 mm stone over the right mid ureter causing low-grade obstruction.  No acute surgical abdominal pathology noted including bowel obstruction, bowel perforation, appendicitis, cholecystitis, or dissection.  On reevaluation patient is resting comfortably in no apparent distress.  He reports that he is feeling much better after Toradol.  He also received IV fluids, pain medicine, and Zofran.  He is tolerating p.o. fluids without difficulty.  Serial abdominal examinations benign.  Feels stable for discharge home.  I think this is reasonable but recommend close follow-up with urology or PCP for follow-up of his creatinine.  Will discharge with Flomax, Zofran, and hydrocodone for severe pain.  Discussed appropriate use of these medications.  [place] controlled substance registry was created with no inconsistencies.  Discussed strict ED return precautions.  Patient and wife verbalized understanding of and agreement with plan and patient stable for discharge home at this time. Final Clinical Impressions(s) / ED Diagnoses   Final diagnoses:  Ureterolithiasis  Hydronephrosis with urinary obstruction due to ureteral calculus  Elevated serum creatinine  Nausea vomiting and diarrhea  Acute right flank pain    ED Discharge Orders         Ordered    tamsulosin (FLOMAX) 0.4 MG CAPS capsule  Daily after supper     01/01/19 1002    HYDROcodone-acetaminophen (NORCO/VICODIN) 5-325 MG tablet  Every 6 hours PRN     01/01/19 1002    ondansetron (ZOFRAN ODT) 4 MG disintegrating tablet   Every 8 hours PRN     01/01/19 1002           Zana Biancardi, Jefferson A, PA-C 01/01/19 1017    Mesner, Barbara Cower, MD 01/02/19 [phone]

## 2019-01-01 NOTE — ED Triage Notes (Signed)
Pt reports N/V, abd pain beginning the evening of 3/19. Wife had same meal, but no sick. Pt denies other sick contact. Cramping/spasm pain to R abd. Minimal PO intake today.

## 2019-01-01 NOTE — ED Notes (Signed)
Patient verbalized understanding of dc instructions, vss, ambulatory with nad.   

## 2019-01-02 LAB — URINE CULTURE: Culture: NO GROWTH

## 2019-02-26 ENCOUNTER — Other Ambulatory Visit: Payer: Self-pay | Admitting: Cardiology

## 2019-04-19 DIAGNOSIS — R5381 Other malaise: Secondary | ICD-10-CM | POA: Diagnosis not present

## 2019-05-16 DIAGNOSIS — Z6828 Body mass index (BMI) 28.0-28.9, adult: Secondary | ICD-10-CM | POA: Diagnosis not present

## 2019-05-16 DIAGNOSIS — F419 Anxiety disorder, unspecified: Secondary | ICD-10-CM | POA: Diagnosis not present

## 2019-05-16 DIAGNOSIS — E782 Mixed hyperlipidemia: Secondary | ICD-10-CM | POA: Diagnosis not present

## 2019-05-16 DIAGNOSIS — I10 Essential (primary) hypertension: Secondary | ICD-10-CM | POA: Diagnosis not present

## 2019-06-01 ENCOUNTER — Other Ambulatory Visit: Payer: Self-pay | Admitting: Cardiology

## 2019-07-13 ENCOUNTER — Ambulatory Visit (INDEPENDENT_AMBULATORY_CARE_PROVIDER_SITE_OTHER): Payer: Federal, State, Local not specified - PPO | Admitting: Cardiology

## 2019-07-13 ENCOUNTER — Other Ambulatory Visit: Payer: Self-pay

## 2019-07-13 ENCOUNTER — Encounter: Payer: Self-pay | Admitting: Cardiology

## 2019-07-13 VITALS — BP 132/80 | HR 80 | Ht 68.0 in | Wt 197.0 lb

## 2019-07-13 DIAGNOSIS — R0789 Other chest pain: Secondary | ICD-10-CM

## 2019-07-13 DIAGNOSIS — I420 Dilated cardiomyopathy: Secondary | ICD-10-CM | POA: Diagnosis not present

## 2019-07-13 DIAGNOSIS — I447 Left bundle-branch block, unspecified: Secondary | ICD-10-CM | POA: Diagnosis not present

## 2019-07-13 NOTE — Progress Notes (Signed)
Cardiology Office Note:    Date:  07/13/2019   ID:  Sean Stack., DOB [birth year], MRN [id number]  PCP:  Greig Right, MD  Cardiologist:  Jenne Campus, MD    Referring MD: Greig Right, MD   Chief Complaint  Patient presents with  . Follow-up  Doing well  History of Present Illness:    Sean Boeke. is a 63 y.o. male with history of cardiomyopathy improvement to 45%, lowest ejection fraction before initiation of medical therapy 35%, chronic left bundle branch block.  Comes today to my office for follow-up overall doing well.  Because of COVID-19 he stopped exercising on a regular basis but about 2 weeks ago he started back.  He is feeling good denies have any chest pain tightness squeezing pressure pain chest no swelling of lower extremities.  Past Medical History:  Diagnosis Date  . Anxiety   . Hyperlipidemia   . Hypertension     Past Surgical History:  Procedure Laterality Date  . HERNIA REPAIR    . Torn tendon and patella      Current Medications: Current Meds  Medication Sig  . aspirin EC 81 MG tablet Take 81 mg by mouth daily.  Marland Kitchen atorvastatin (LIPITOR) 20 MG tablet Take 20 mg by mouth daily at 6 PM.   . carvedilol (COREG) 12.5 MG tablet TAKE 1 TABLET BY MOUTH TWICE DAILY WITH MEALS  . ENTRESTO 97-103 MG Take 1.5 tablets by mouth 2 (two) times daily.   . Multiple Vitamin (MULTIVITAMIN) tablet Take 1 tablet by mouth daily.  Marland Kitchen PARoxetine (PAXIL) 30 MG tablet Take 30 mg by mouth daily.     Allergies:   Patient has no known allergies.   Social History   Socioeconomic History  . Marital status: Married    Spouse name: Not on file  . Number of children: Not on file  . Years of education: Not on file  . Highest education level: Not on file  Occupational History  . Not on file  Social Needs  . Financial resource strain: Not on file  . Food insecurity    Worry: Not on file    Inability: Not on file  . Transportation needs    Medical: Not  on file    Non-medical: Not on file  Tobacco Use  . Smoking status: Never Smoker  . Smokeless tobacco: Current User  Substance and Sexual Activity  . Alcohol use: Yes  . Drug use: No  . Sexual activity: Not on file  Lifestyle  . Physical activity    Days per week: Not on file    Minutes per session: Not on file  . Stress: Not on file  Relationships  . Social Herbalist on phone: Not on file    Gets together: Not on file    Attends religious service: Not on file    Active member of club or organization: Not on file    Attends meetings of clubs or organizations: Not on file    Relationship status: Not on file  Other Topics Concern  . Not on file  Social History Narrative  . Not on file     Family History: The patient's family history includes Cancer in his father; Heart failure in his mother. ROS:   Please see the history of present illness.    All 14 point review of systems negative except as described per history of present illness  EKGs/Labs/Other Studies Reviewed:  Recent Labs: 01/01/2019: ALT 26; BUN 18; Creatinine, Ser 1.96; Hemoglobin 15.8; Platelets 268; Potassium 3.8; Sodium 139  Recent Lipid Panel    Component Value Date/Time   CHOL 180 05/27/2018 1544   TRIG 68 05/27/2018 1544   HDL 70 05/27/2018 1544   CHOLHDL 2.6 05/27/2018 1544   LDLCALC 96 05/27/2018 1544    Physical Exam:    VS:  BP 132/80   Pulse 80   Ht 5\' 8"  (1.727 m)   Wt 197 lb (89.4 kg)   SpO2 97%   BMI 29.95 kg/m     Wt Readings from Last 3 Encounters:  07/13/19 197 lb (89.4 kg)  01/01/19 185 lb (83.9 kg)  08/27/18 197 lb 9.6 oz (89.6 kg)     GEN:  Well nourished, well developed in no acute distress HEENT: Normal NECK: No JVD; No carotid bruits LYMPHATICS: No lymphadenopathy CARDIAC: RRR, no murmurs, no rubs, no gallops RESPIRATORY:  Clear to auscultation without rales, wheezing or rhonchi  ABDOMEN: Soft, non-tender, non-distended MUSCULOSKELETAL:  No edema; No  deformity  SKIN: Warm and dry LOWER EXTREMITIES: no swelling NEUROLOGIC:  Alert and oriented x 3 PSYCHIATRIC:  Normal affect   ASSESSMENT:    1. Left bundle branch block (LBBB)   2. Dilated cardiomyopathy (HCC)   3. Atypical chest pain    PLAN:    In order of problems listed above:  1. Left bundle branch block chronic noted. 2. Dilated cardiomyopathy echocardiogram will be repeated to check left ventricular ejection fraction.  In the meantime continue present medications. 3. Atypical chest pain denies having any 4. Dyslipidemia will check fasting lipid profile   Medication Adjustments/Labs and Tests Ordered: Current medicines are reviewed at length with the patient today.  Concerns regarding medicines are outlined above.  No orders of the defined types were placed in this encounter.  Medication changes: No orders of the defined types were placed in this encounter.   Signed, 08/29/18, MD, [facility] 07/13/2019 3:34 PM    [facility]

## 2019-07-13 NOTE — Patient Instructions (Addendum)
Medication Instructions:  Your physician recommends that you continue on your current medications as directed. Please refer to the Current Medication list given to you today.  If you need a refill on your cardiac medications before your next appointment, please call your pharmacy.   Lab work: Your physician recommends that you return for lab work  Fasting Lipid Panel If you have labs (blood work) drawn today and your tests are completely normal, you will receive your results only by: Marland Kitchen MyChart Message (if you have MyChart) OR . A paper copy in the mail If you have any lab test that is abnormal or we need to change your treatment, we will call you to review the results.  Testing/Procedures: Your physician has requested that you have an echocardiogram. Echocardiography is a painless test that uses sound waves to create images of your heart. It provides your doctor with information about the size and shape of your heart and how well your heart's chambers and valves are working. This procedure takes approximately one hour. There are no restrictions for this procedure.    Follow-Up: At [facility], you and your health needs are our priority.  As part of our continuing mission to provide you with exceptional heart care, we have created designated Provider Care Teams.  These Care Teams include your primary Cardiologist (physician) and Advanced Practice Providers (APPs -  Physician Assistants and Nurse Practitioners) who all work together to provide you with the care you need, when you need it. You will need a follow up appointment in 5 months.  Please call our office 2 months in advance to schedule this appointment.  You may see Dr. Agustin Cree or another member of our [facility] Provider Team in [place]: Shirlee More, MD . Jyl Heinz, MD

## 2019-07-13 NOTE — Addendum Note (Signed)
Addended by: Polly Cobia A on: 07/13/2019 03:51 PM   Modules accepted: Orders

## 2019-07-23 ENCOUNTER — Other Ambulatory Visit: Payer: Self-pay | Admitting: Cardiology

## 2019-07-25 NOTE — Telephone Encounter (Signed)
Rx refill sent to pharmacy. 

## 2019-08-17 ENCOUNTER — Other Ambulatory Visit: Payer: Self-pay

## 2019-08-17 ENCOUNTER — Ambulatory Visit (INDEPENDENT_AMBULATORY_CARE_PROVIDER_SITE_OTHER): Payer: Federal, State, Local not specified - PPO

## 2019-08-17 DIAGNOSIS — I447 Left bundle-branch block, unspecified: Secondary | ICD-10-CM | POA: Diagnosis not present

## 2019-08-17 DIAGNOSIS — I420 Dilated cardiomyopathy: Secondary | ICD-10-CM

## 2019-08-17 NOTE — Progress Notes (Signed)
Complete echocardiogram has been performed.  Jimmy Zahria Ding RDCS, RVT 

## 2019-08-24 DIAGNOSIS — M10021 Idiopathic gout, right elbow: Secondary | ICD-10-CM | POA: Diagnosis not present

## 2019-08-25 ENCOUNTER — Telehealth: Payer: Self-pay | Admitting: Cardiology

## 2019-08-25 NOTE — Telephone Encounter (Signed)
Please call patient with results of echo.  °

## 2019-08-26 NOTE — Telephone Encounter (Signed)
Patient stopped by and asked for Dr. Raliegh Ip to call him for questions he has prior to his leaving to go out of town.

## 2019-08-26 NOTE — Telephone Encounter (Signed)
Patient informed of echo results

## 2019-08-30 NOTE — Telephone Encounter (Signed)
Patient calling again and wants to talk to Dr. Rondel Oh.

## 2019-08-31 NOTE — Telephone Encounter (Signed)
I spoke to him, all questiins has been answered

## 2020-01-18 DIAGNOSIS — Z23 Encounter for immunization: Secondary | ICD-10-CM | POA: Diagnosis not present

## 2020-02-01 DIAGNOSIS — R739 Hyperglycemia, unspecified: Secondary | ICD-10-CM | POA: Diagnosis not present

## 2020-02-01 DIAGNOSIS — R782 Finding of cocaine in blood: Secondary | ICD-10-CM | POA: Diagnosis not present

## 2020-02-01 DIAGNOSIS — E782 Mixed hyperlipidemia: Secondary | ICD-10-CM | POA: Diagnosis not present

## 2020-02-01 DIAGNOSIS — I10 Essential (primary) hypertension: Secondary | ICD-10-CM | POA: Diagnosis not present

## 2020-02-01 DIAGNOSIS — I428 Other cardiomyopathies: Secondary | ICD-10-CM | POA: Diagnosis not present

## 2020-04-04 ENCOUNTER — Telehealth: Payer: Self-pay | Admitting: Cardiology

## 2020-04-04 DIAGNOSIS — Z6829 Body mass index (BMI) 29.0-29.9, adult: Secondary | ICD-10-CM | POA: Diagnosis not present

## 2020-04-04 DIAGNOSIS — E663 Overweight: Secondary | ICD-10-CM | POA: Diagnosis not present

## 2020-04-04 DIAGNOSIS — F17220 Nicotine dependence, chewing tobacco, uncomplicated: Secondary | ICD-10-CM | POA: Diagnosis not present

## 2020-04-04 DIAGNOSIS — Z0000 Encounter for general adult medical examination without abnormal findings: Secondary | ICD-10-CM | POA: Diagnosis not present

## 2020-04-04 NOTE — Telephone Encounter (Signed)
      I went in pt's chart to see when pt need his next office visit. I made pt next appt for 05-23-20.

## 2020-05-23 ENCOUNTER — Other Ambulatory Visit: Payer: Self-pay

## 2020-05-23 ENCOUNTER — Encounter: Payer: Self-pay | Admitting: Cardiology

## 2020-05-23 ENCOUNTER — Ambulatory Visit: Payer: Federal, State, Local not specified - PPO | Admitting: Cardiology

## 2020-05-23 VITALS — BP 126/80 | HR 72 | Ht 68.0 in | Wt 196.2 lb

## 2020-05-23 DIAGNOSIS — R0600 Dyspnea, unspecified: Secondary | ICD-10-CM

## 2020-05-23 DIAGNOSIS — I447 Left bundle-branch block, unspecified: Secondary | ICD-10-CM | POA: Diagnosis not present

## 2020-05-23 DIAGNOSIS — I10 Essential (primary) hypertension: Secondary | ICD-10-CM | POA: Diagnosis not present

## 2020-05-23 DIAGNOSIS — I420 Dilated cardiomyopathy: Secondary | ICD-10-CM | POA: Diagnosis not present

## 2020-05-23 DIAGNOSIS — R0789 Other chest pain: Secondary | ICD-10-CM

## 2020-05-23 DIAGNOSIS — R0609 Other forms of dyspnea: Secondary | ICD-10-CM

## 2020-05-23 DIAGNOSIS — I517 Cardiomegaly: Secondary | ICD-10-CM | POA: Diagnosis not present

## 2020-05-23 HISTORY — DX: Other forms of dyspnea: R06.09

## 2020-05-23 HISTORY — DX: Dyspnea, unspecified: R06.00

## 2020-05-23 NOTE — Progress Notes (Signed)
Cardiology Office Note:    Date:  05/23/2020   ID:  Sean Wells., DOB Sep 03, 1956, MRN [id number]  PCP:  [name], MD  Cardiologist:  [name], MD    Referring MD: [name], MD   No chief complaint on file. I am doing fine  History of Present Illness:    Sean Wells. is a 64 y.o. male with past medical history significant for left bundle branch block, diminished ejection fraction, however latest echocardiogram done last year showed normal left ventricle ejection fraction, essential hypertension, dyslipidemia.  Comes today  for follow-up.  Overall doing well he went back to gym and he noticed that he does have more shortness of breath than before of course it could be related to deconditioning but he is concerned about the fact that he did have cardiomyopathy before.  Denies having any palpitations, no chest pain burning squeezing in the chest.  There is no swelling of lower extremities.  Past Medical History:  Diagnosis Date  . Anxiety   . Hyperlipidemia   . Hypertension     Past Surgical History:  Procedure Laterality Date  . HERNIA REPAIR    . Torn tendon and patella      Current Medications: Current Meds  Medication Sig  . aspirin EC 81 MG tablet Take 81 mg by mouth daily.  Marland Kitchen atorvastatin (LIPITOR) 20 MG tablet Take 20 mg by mouth daily at 6 PM.   . carvedilol (COREG) 12.5 MG tablet TAKE 1 TABLET BY MOUTH TWICE DAILY WITH MEALS  . ENTRESTO 97-103 MG Take 1.5 tablets by mouth 2 (two) times daily.   . Multiple Vitamin (MULTIVITAMIN) tablet Take 1 tablet by mouth daily.  Marland Kitchen PARoxetine (PAXIL) 30 MG tablet Take 30 mg by mouth daily.     Allergies:   Patient has no known allergies.   Social History   Socioeconomic History  . Marital status: Married    Spouse name: Not on file  . Number of children: Not on file  . Years of education: Not on file  . Highest education level: Not on file  Occupational History  . Not on file  Tobacco  Use  . Smoking status: Never Smoker  . Smokeless tobacco: Current User  Vaping Use  . Vaping Use: Never used  Substance and Sexual Activity  . Alcohol use: Yes  . Drug use: No  . Sexual activity: Not on file  Other Topics Concern  . Not on file  Social History Narrative  . Not on file   Social Determinants of Health   Financial Resource Strain:   . Difficulty of Paying Living Expenses:   Food Insecurity:   . Worried About Programme researcher, broadcasting/film/video in the Last Year:   . Barista in the Last Year:   Transportation Needs:   . Freight forwarder (Medical):   Marland Kitchen Lack of Transportation (Non-Medical):   Physical Activity:   . Days of Exercise per Week:   . Minutes of Exercise per Session:   Stress:   . Feeling of Stress :   Social Connections:   . Frequency of Communication with Friends and Family:   . Frequency of Social Gatherings with Friends and Family:   . Attends Religious Services:   . Active Member of Clubs or Organizations:   . Attends Banker Meetings:   Marland Kitchen Marital Status:      Family History: The patient's family history includes Cancer in his father;  Heart failure in his mother. ROS:   Please see the history of present illness.    All 14 point review of systems negative except as described per history of present illness  EKGs/Labs/Other Studies Reviewed:      Recent Labs: No results found for requested labs within last 8760 hours.  Recent Lipid Panel    Component Value Date/Time   CHOL 180 05/27/2018 1544   TRIG 68 05/27/2018 1544   HDL 70 05/27/2018 1544   CHOLHDL 2.6 05/27/2018 1544   LDLCALC 96 05/27/2018 1544    Physical Exam:    VS:  BP 126/80   Pulse 72   Ht 5\' 8"  (1.727 m)   Wt 196 lb 3.2 oz (89 kg)   SpO2 94%   BMI 29.83 kg/m     Wt Readings from Last 3 Encounters:  05/23/20 196 lb 3.2 oz (89 kg)  07/13/19 197 lb (89.4 kg)  01/01/19 185 lb (83.9 kg)     GEN:  Well nourished, well developed in no acute  distress HEENT: Normal NECK: No JVD; No carotid bruits LYMPHATICS: No lymphadenopathy CARDIAC: RRR, no murmurs, no rubs, no gallops RESPIRATORY:  Clear to auscultation without rales, wheezing or rhonchi  ABDOMEN: Soft, non-tender, non-distended MUSCULOSKELETAL:  No edema; No deformity  SKIN: Warm and dry LOWER EXTREMITIES: no swelling NEUROLOGIC:  Alert and oriented x 3 PSYCHIATRIC:  Normal affect   ASSESSMENT:    1. Dilated cardiomyopathy (HCC)   2. Left bundle branch block (LBBB)   3. Essential hypertension   4. Cardiomegaly   5. Atypical chest pain   6. Dyspnea on exertion    PLAN:    In order of problems listed above:  1. Dilated cardiomyopathy.  Last echocardiogram preserved ejection fraction but now developed dyspnea on exertion which could be simply deconditioning.  He went back to the gym.  I will ask him to have an echocardiogram repeated. 2. Left bundle branch block: Stable present noted. 3. Essential hypertension blood pressure well controlled we will continue present management. 4. History of cardiomegaly again echocardiogram will be done. 5. Atypical chest pain: Denies having any. 6. Dyspnea on exertion need to be investigated I suspect the reason for that is simply deconditioning.  He is to go to gym on the regular basis but stopped doing this months ago because of COVID-19 now he is back and he developed shortness of breath I will ask him to have an echocardiogram to make sure he get no worsening of his cardiomyopathy. 7. Dyslipidemia I did review his K PN which showed me his cholesterol LDL 100 and HDL of 77 this is from February 01, 2020.  We will continue present management which include Lipitor 20.   Medication Adjustments/Labs and Tests Ordered: Current medicines are reviewed at length with the patient today.  Concerns regarding medicines are outlined above.  Orders Placed This Encounter  Procedures  . ECHOCARDIOGRAM COMPLETE   Medication changes: No orders  of the defined types were placed in this encounter.   Signed, February 03, 2020, MD, [facility] 05/23/2020 3:27 PM    [facility]

## 2020-05-23 NOTE — Patient Instructions (Signed)
Medication Instructions:  Your physician recommends that you continue on your current medications as directed. Please refer to the Current Medication list given to you today.  *If you need a refill on your cardiac medications before your next appointment, please call your pharmacy*   Lab Work: None If you have labs (blood work) drawn today and your tests are completely normal, you will receive your results only by: MyChart Message (if you have MyChart) OR A paper copy in the mail If you have any lab test that is abnormal or we need to change your treatment, we will call you to review the results.   Testing/Procedures: Your physician has requested that you have an echocardiogram. Echocardiography is a painless test that uses sound waves to create images of your heart. It provides your doctor with information about the size and shape of your heart and how well your heart's chambers and valves are working. This procedure takes approximately one hour. There are no restrictions for this procedure.    Follow-Up: At [facility], you and your health needs are our priority.  As part of our continuing mission to provide you with exceptional heart care, we have created designated Provider Care Teams.  These Care Teams include your primary Cardiologist (physician) and Advanced Practice Providers (APPs -  Physician Assistants and Nurse Practitioners) who all work together to provide you with the care you need, when you need it.  We recommend signing up for the patient portal called "MyChart".  Sign up information is provided on this After Visit Summary.  MyChart is used to connect with patients for Virtual Visits (Telemedicine).  Patients are able to view lab/test results, encounter notes, upcoming appointments, etc.  Non-urgent messages can be sent to your provider as well.   To learn more about what you can do with MyChart, go to [email or web address].    Your next appointment:   6  month(s)  The format for your next appointment:   In Person  Provider:   Robert Krasowski, MD   Other Instructions   

## 2020-06-27 DIAGNOSIS — J069 Acute upper respiratory infection, unspecified: Secondary | ICD-10-CM | POA: Diagnosis not present

## 2020-08-27 ENCOUNTER — Other Ambulatory Visit: Payer: Self-pay

## 2020-08-27 ENCOUNTER — Ambulatory Visit (INDEPENDENT_AMBULATORY_CARE_PROVIDER_SITE_OTHER): Payer: Federal, State, Local not specified - PPO

## 2020-08-27 DIAGNOSIS — I420 Dilated cardiomyopathy: Secondary | ICD-10-CM

## 2020-08-27 LAB — ECHOCARDIOGRAM COMPLETE
Area-P 1/2: 3.17 cm2
S' Lateral: 3.7 cm

## 2020-08-27 NOTE — Progress Notes (Signed)
Complete echocardiogram performed.  Jimmy Neill Jurewicz RDCS, RVT  

## 2020-09-11 IMAGING — CT CT ABDOMEN AND PELVIS WITH CONTRAST
2 of 5 series · 15 of 46 positions shown, 17 images · IV contrast (omnipaque)
Comparison: CT pelvis 03/19/2017

CLINICAL DATA: Abdominal pain 2 days with nausea and vomiting. Pain
right-sided.

EXAM:
CT ABDOMEN AND PELVIS WITH CONTRAST
TECHNIQUE: Multidetector CT imaging of the abdomen and pelvis was performed
using the standard protocol following bolus administration of
intravenous contrast.
CONTRAST:  75mL OMNIPAQUE IOHEXOL 300 MG/ML  SOLN

[Series 3: a/p w/ 5mm · axial · 0.75mm/px · z∈[-576,-120]mm · 12 of 101 slices shown, 14 images]
[im 5/101  soft-tissue]
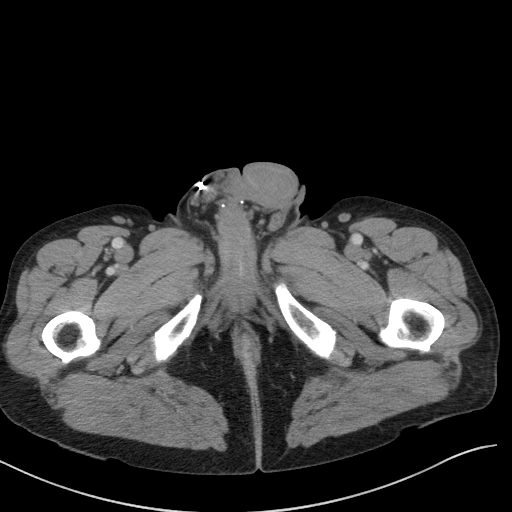
[im 5/101  bone]
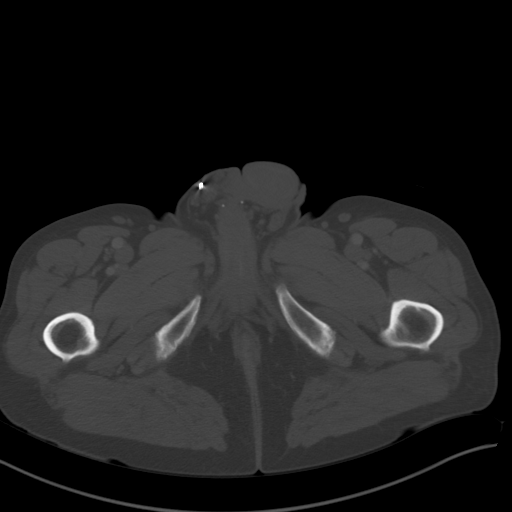
[im 15/101  soft-tissue]
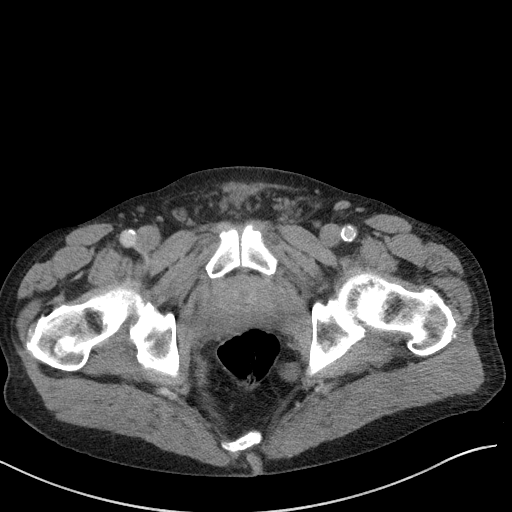
[im 24/101  soft-tissue]
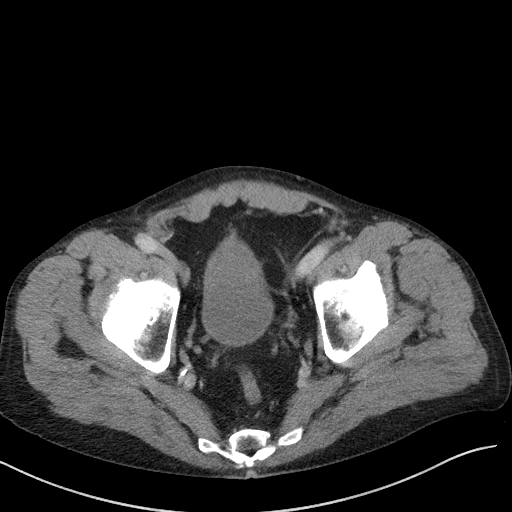
[im 29/101  soft-tissue]
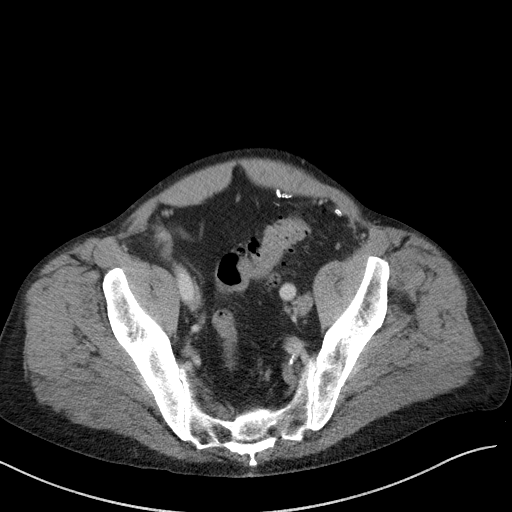
[im 39/101  soft-tissue]
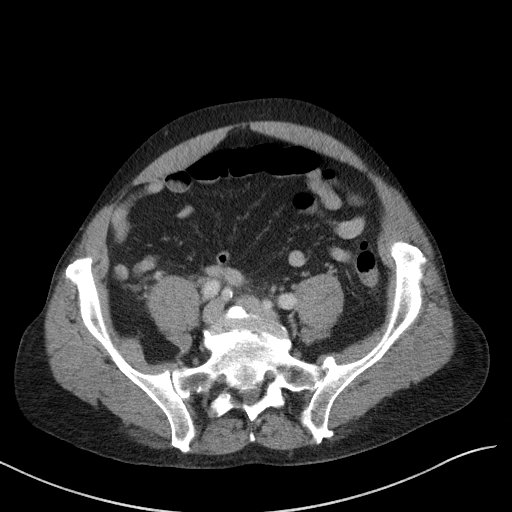
[im 48/101  soft-tissue]
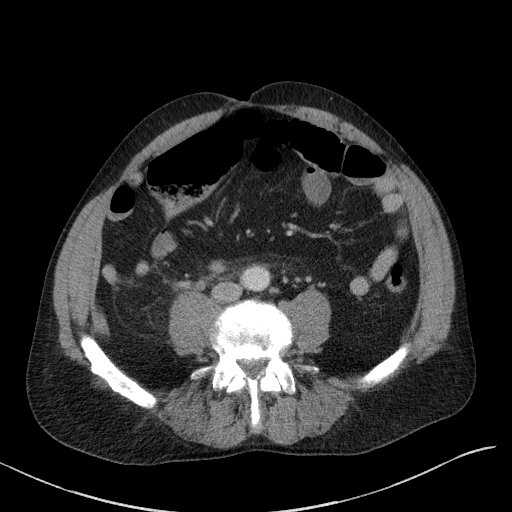
[im 53/101  soft-tissue]
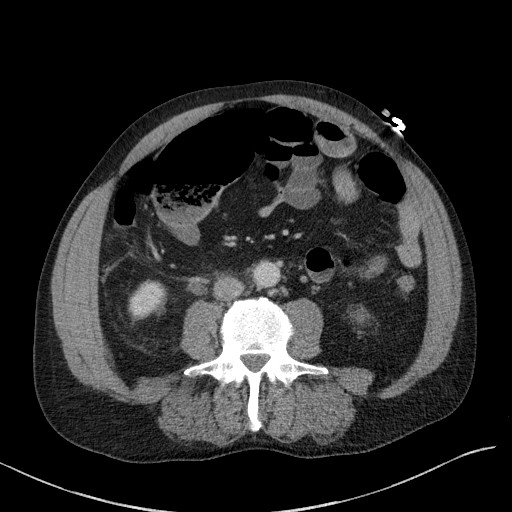
[im 62/101  soft-tissue]
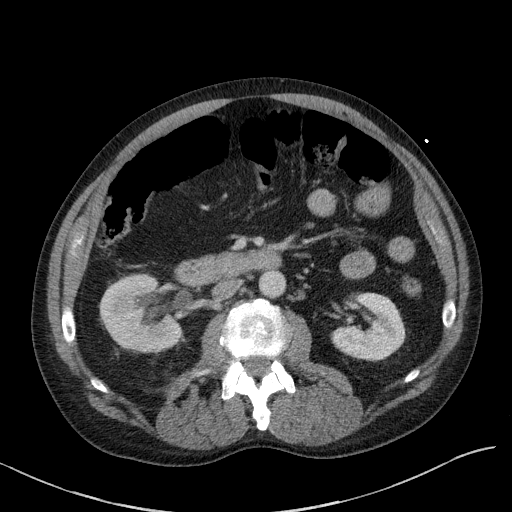
[im 72/101  soft-tissue]
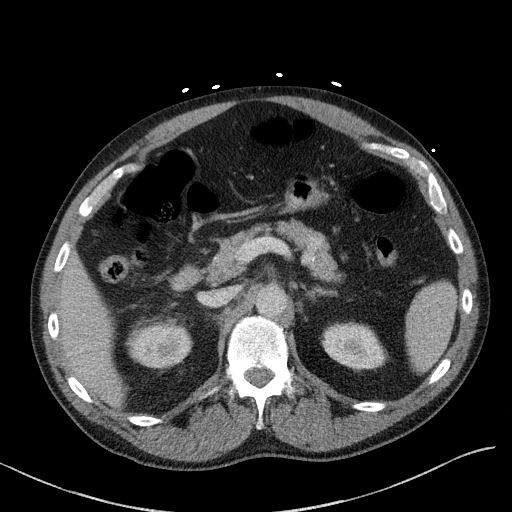
[im 72/101  bone]
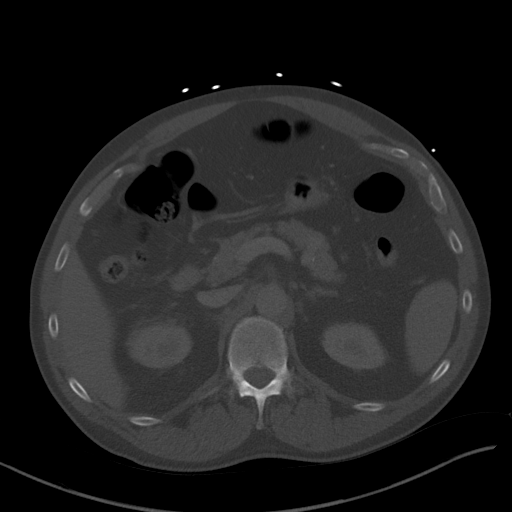
[im 77/101  soft-tissue]
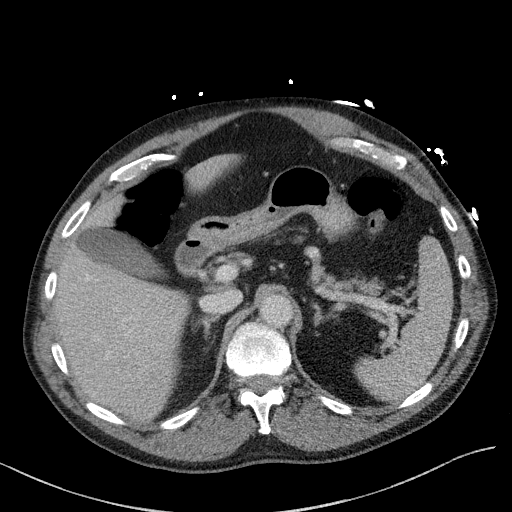
[im 86/101  soft-tissue]
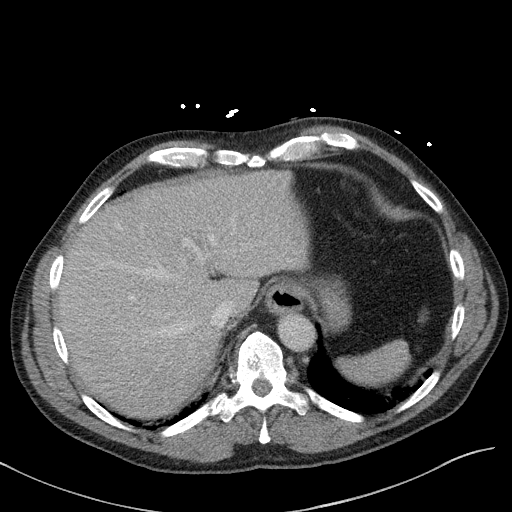
[im 96/101  soft-tissue]
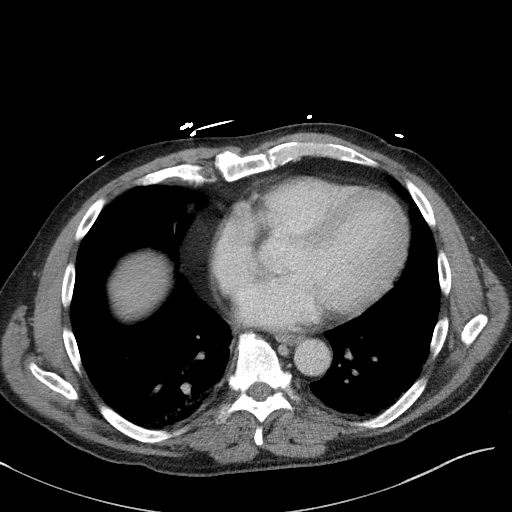

[Series 6: a/p w/ cor · coronal · 0.68mm/px · 3 of 151 slices shown]
[im 51/151  soft-tissue]
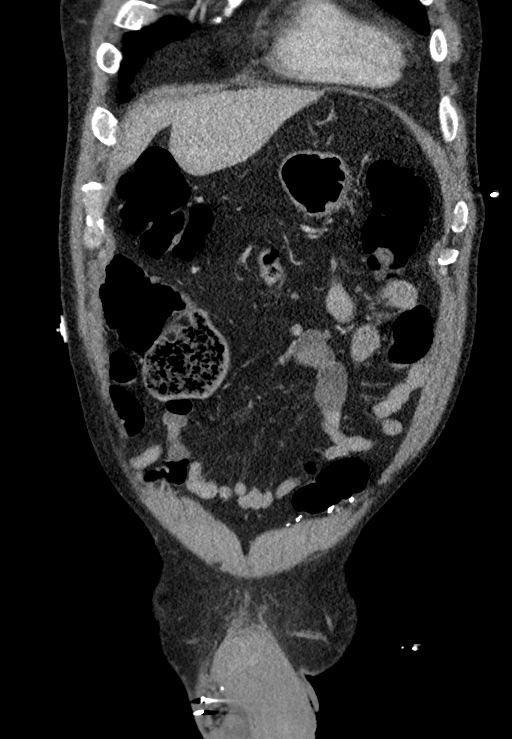
[im 67/151  soft-tissue]
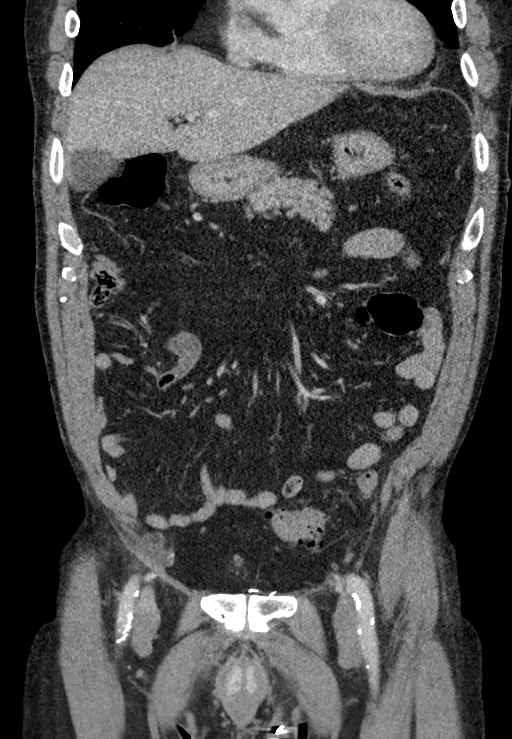
[im 84/151  soft-tissue]
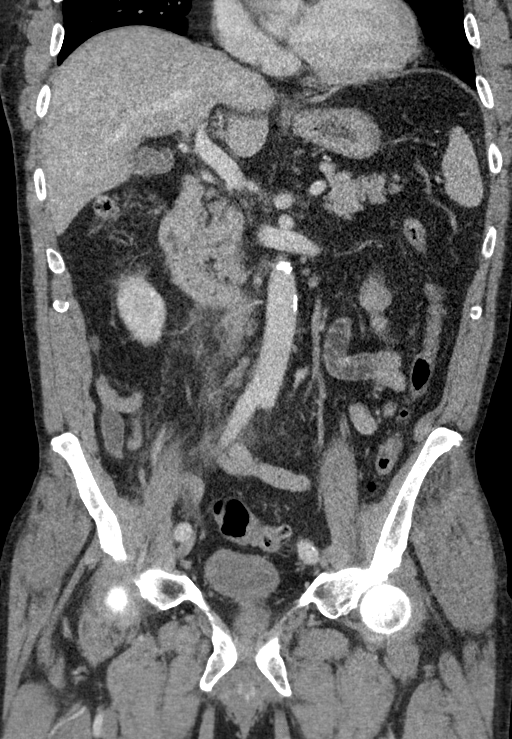

[15 of 46 positions shown; findings below may reference images not displayed]

FINDINGS: Lower chest: Evidence of bibasilar atelectasis. No effusion. Mild
cardiomegaly.

Hepatobiliary: 1 cm hypodensity over the right lobe of the liver too
small to characterize but likely a hemangioma. Gallbladder and
biliary tree are within normal.

Pancreas: Normal.

Spleen: Normal.

Adrenals/Urinary Tract: Adrenal glands are normal. Kidneys normal in
size. There is mild delayed uptake of contrast by the right kidney
with mild right-sided hydronephrosis and right perinephric
fluid/inflammation. Mild dilatation of the proximal right ureter as
there is a 4-5 mm stone over the right mid ureter causing this
low-grade obstruction. No left renal stones. Left ureter and bladder
are normal.

Stomach/Bowel: Stomach and small bowel are within normal. Appendix
is normal. Minimal diverticulosis of the colon most prominent over
the sigmoid colon.

Vascular/Lymphatic: Minimal calcified plaque over the abdominal
aorta. No adenopathy.

Reproductive: Normal.  Evidence of previous vasectomy.

Other: Surgical clips over the left lower quadrant just deep to the
abdominal wall.

Musculoskeletal: Degenerative change of the spine with disc disease
at the L4-5 and L5-S1 levels. Mild degenerate change of the hips.
Hypodensities over the vertebral bodies T11, L2 and S1 likely
hemangiomas.
IMPRESSION: 4-5 mm stone over the right mid ureter causing low-grade
obstruction.

Colonic diverticulosis.

1 cm hypodensity over the right lobe of the liver likely a
hemangioma.

Aortic Atherosclerosis ([id number]).

## 2020-11-16 DIAGNOSIS — E782 Mixed hyperlipidemia: Secondary | ICD-10-CM | POA: Diagnosis not present

## 2020-11-16 DIAGNOSIS — I447 Left bundle-branch block, unspecified: Secondary | ICD-10-CM | POA: Diagnosis not present

## 2020-11-16 DIAGNOSIS — I10 Essential (primary) hypertension: Secondary | ICD-10-CM | POA: Diagnosis not present

## 2020-11-16 DIAGNOSIS — I420 Dilated cardiomyopathy: Secondary | ICD-10-CM | POA: Diagnosis not present

## 2020-11-20 DIAGNOSIS — E785 Hyperlipidemia, unspecified: Secondary | ICD-10-CM | POA: Insufficient documentation

## 2020-11-20 DIAGNOSIS — F419 Anxiety disorder, unspecified: Secondary | ICD-10-CM | POA: Insufficient documentation

## 2020-11-28 ENCOUNTER — Ambulatory Visit: Payer: Federal, State, Local not specified - PPO | Admitting: Cardiology

## 2020-12-06 DIAGNOSIS — I420 Dilated cardiomyopathy: Secondary | ICD-10-CM | POA: Diagnosis not present

## 2020-12-06 DIAGNOSIS — I447 Left bundle-branch block, unspecified: Secondary | ICD-10-CM | POA: Diagnosis not present

## 2020-12-09 DIAGNOSIS — I447 Left bundle-branch block, unspecified: Secondary | ICD-10-CM | POA: Diagnosis not present

## 2020-12-09 DIAGNOSIS — I420 Dilated cardiomyopathy: Secondary | ICD-10-CM | POA: Diagnosis not present

## 2021-02-01 DIAGNOSIS — I420 Dilated cardiomyopathy: Secondary | ICD-10-CM | POA: Diagnosis not present

## 2021-02-01 DIAGNOSIS — I10 Essential (primary) hypertension: Secondary | ICD-10-CM | POA: Diagnosis not present

## 2021-02-01 DIAGNOSIS — I447 Left bundle-branch block, unspecified: Secondary | ICD-10-CM | POA: Diagnosis not present

## 2021-02-01 DIAGNOSIS — E782 Mixed hyperlipidemia: Secondary | ICD-10-CM | POA: Diagnosis not present

## 2021-02-06 DIAGNOSIS — F419 Anxiety disorder, unspecified: Secondary | ICD-10-CM | POA: Diagnosis not present

## 2021-02-06 DIAGNOSIS — I10 Essential (primary) hypertension: Secondary | ICD-10-CM | POA: Diagnosis not present

## 2021-02-06 DIAGNOSIS — Z1211 Encounter for screening for malignant neoplasm of colon: Secondary | ICD-10-CM | POA: Diagnosis not present

## 2021-02-06 DIAGNOSIS — N529 Male erectile dysfunction, unspecified: Secondary | ICD-10-CM | POA: Diagnosis not present

## 2021-06-11 DIAGNOSIS — Z125 Encounter for screening for malignant neoplasm of prostate: Secondary | ICD-10-CM | POA: Diagnosis not present

## 2021-06-11 DIAGNOSIS — E782 Mixed hyperlipidemia: Secondary | ICD-10-CM | POA: Diagnosis not present

## 2021-06-11 DIAGNOSIS — I10 Essential (primary) hypertension: Secondary | ICD-10-CM | POA: Diagnosis not present

## 2021-06-11 DIAGNOSIS — Z23 Encounter for immunization: Secondary | ICD-10-CM | POA: Diagnosis not present

## 2021-06-11 DIAGNOSIS — I420 Dilated cardiomyopathy: Secondary | ICD-10-CM | POA: Diagnosis not present

## 2021-07-19 DIAGNOSIS — I420 Dilated cardiomyopathy: Secondary | ICD-10-CM | POA: Diagnosis not present

## 2021-07-19 DIAGNOSIS — I447 Left bundle-branch block, unspecified: Secondary | ICD-10-CM | POA: Diagnosis not present

## 2021-07-19 DIAGNOSIS — Z800 Family history of malignant neoplasm of digestive organs: Secondary | ICD-10-CM | POA: Diagnosis not present

## 2021-07-19 DIAGNOSIS — Z86010 Personal history of colonic polyps: Secondary | ICD-10-CM | POA: Diagnosis not present

## 2021-07-25 DIAGNOSIS — N529 Male erectile dysfunction, unspecified: Secondary | ICD-10-CM | POA: Diagnosis not present

## 2021-07-25 DIAGNOSIS — R972 Elevated prostate specific antigen [PSA]: Secondary | ICD-10-CM | POA: Diagnosis not present

## 2021-07-25 DIAGNOSIS — N200 Calculus of kidney: Secondary | ICD-10-CM | POA: Diagnosis not present

## 2021-08-12 DIAGNOSIS — Z23 Encounter for immunization: Secondary | ICD-10-CM | POA: Diagnosis not present

## 2021-09-19 DIAGNOSIS — R972 Elevated prostate specific antigen [PSA]: Secondary | ICD-10-CM | POA: Diagnosis not present
